# Patient Record
Sex: Male | Born: 1950 | Race: Black or African American | Hispanic: No | Marital: Married | Smoking: Current every day smoker
Health system: Southern US, Community
[De-identification: ages and names within clinical notes are randomized; demographics above are authoritative.]

## PROBLEM LIST (undated history)

## (undated) DIAGNOSIS — R079 Chest pain, unspecified: Secondary | ICD-10-CM

## (undated) DIAGNOSIS — Z87438 Personal history of other diseases of male genital organs: Secondary | ICD-10-CM

## (undated) DIAGNOSIS — E782 Mixed hyperlipidemia: Secondary | ICD-10-CM

## (undated) DIAGNOSIS — I10 Essential (primary) hypertension: Secondary | ICD-10-CM

## (undated) DIAGNOSIS — Z720 Tobacco use: Secondary | ICD-10-CM

## (undated) HISTORY — DX: Tobacco use: Z72.0

## (undated) HISTORY — DX: Essential (primary) hypertension: I10

## (undated) HISTORY — DX: Mixed hyperlipidemia: E78.2

## (undated) HISTORY — DX: Chest pain, unspecified: R07.9

## (undated) HISTORY — DX: Personal history of other diseases of male genital organs: Z87.438

---

## 2004-10-15 ENCOUNTER — Inpatient Hospital Stay (HOSPITAL_COMMUNITY): Admission: AD | Admit: 2004-10-15 | Discharge: 2004-10-18 | Payer: Self-pay | Admitting: Internal Medicine

## 2004-10-15 ENCOUNTER — Encounter: Payer: Self-pay | Admitting: Emergency Medicine

## 2004-10-18 ENCOUNTER — Ambulatory Visit: Payer: Self-pay | Admitting: Internal Medicine

## 2004-10-18 ENCOUNTER — Encounter: Payer: Self-pay | Admitting: Cardiology

## 2004-11-02 ENCOUNTER — Ambulatory Visit: Payer: Self-pay | Admitting: Internal Medicine

## 2008-05-14 ENCOUNTER — Ambulatory Visit: Payer: Self-pay | Admitting: Cardiovascular Disease

## 2008-05-14 ENCOUNTER — Inpatient Hospital Stay (HOSPITAL_COMMUNITY): Admission: EM | Admit: 2008-05-14 | Discharge: 2008-05-15 | Payer: Self-pay | Admitting: Unknown Physician Specialty

## 2008-05-14 ENCOUNTER — Encounter: Admission: RE | Admit: 2008-05-14 | Discharge: 2008-05-15 | Payer: Self-pay | Admitting: Internal Medicine

## 2008-05-14 HISTORY — PX: CARDIAC CATHETERIZATION: SHX172

## 2008-05-28 ENCOUNTER — Ambulatory Visit: Payer: Self-pay | Admitting: Cardiology

## 2008-05-28 ENCOUNTER — Encounter: Payer: Self-pay | Admitting: Physician Assistant

## 2008-05-28 ENCOUNTER — Encounter (INDEPENDENT_AMBULATORY_CARE_PROVIDER_SITE_OTHER): Payer: Self-pay | Admitting: *Deleted

## 2008-05-28 DIAGNOSIS — F17200 Nicotine dependence, unspecified, uncomplicated: Secondary | ICD-10-CM

## 2008-05-28 DIAGNOSIS — R079 Chest pain, unspecified: Secondary | ICD-10-CM | POA: Insufficient documentation

## 2008-05-28 DIAGNOSIS — E785 Hyperlipidemia, unspecified: Secondary | ICD-10-CM

## 2008-05-28 DIAGNOSIS — I10 Essential (primary) hypertension: Secondary | ICD-10-CM

## 2008-05-28 DIAGNOSIS — Z87898 Personal history of other specified conditions: Secondary | ICD-10-CM

## 2008-05-28 DIAGNOSIS — R42 Dizziness and giddiness: Secondary | ICD-10-CM | POA: Insufficient documentation

## 2008-07-30 ENCOUNTER — Ambulatory Visit: Payer: Self-pay | Admitting: Cardiology

## 2008-07-30 DIAGNOSIS — I2510 Atherosclerotic heart disease of native coronary artery without angina pectoris: Secondary | ICD-10-CM | POA: Insufficient documentation

## 2008-09-11 ENCOUNTER — Ambulatory Visit: Payer: Self-pay | Admitting: Cardiology

## 2008-10-01 ENCOUNTER — Encounter: Payer: Self-pay | Admitting: Cardiology

## 2008-10-17 ENCOUNTER — Ambulatory Visit: Payer: Self-pay | Admitting: Cardiology

## 2008-10-17 LAB — CONVERTED CEMR LAB: Total CK: 177 units/L (ref 7–232)

## 2008-11-26 ENCOUNTER — Encounter (INDEPENDENT_AMBULATORY_CARE_PROVIDER_SITE_OTHER): Payer: Self-pay | Admitting: *Deleted

## 2009-01-01 ENCOUNTER — Encounter: Payer: Self-pay | Admitting: Cardiology

## 2009-01-01 ENCOUNTER — Telehealth (INDEPENDENT_AMBULATORY_CARE_PROVIDER_SITE_OTHER): Payer: Self-pay | Admitting: *Deleted

## 2009-01-01 ENCOUNTER — Ambulatory Visit: Payer: Self-pay | Admitting: Cardiology

## 2009-01-06 ENCOUNTER — Ambulatory Visit: Payer: Self-pay | Admitting: Cardiology

## 2009-01-09 ENCOUNTER — Ambulatory Visit: Payer: Self-pay | Admitting: Internal Medicine

## 2009-01-20 LAB — CONVERTED CEMR LAB
Alkaline Phosphatase: 41 units/L (ref 39–117)
Bilirubin, Direct: 0 mg/dL (ref 0.0–0.3)
HDL: 45.5 mg/dL (ref >39.00)
LDL Cholesterol: 108 mg/dL — ABNORMAL HIGH (ref 0–99)
Total Bilirubin: 1.1 mg/dL (ref 0.3–1.2)
Total CHOL/HDL Ratio: 4
Triglycerides: 123 mg/dL (ref 0.0–149.0)

## 2009-02-20 ENCOUNTER — Encounter (INDEPENDENT_AMBULATORY_CARE_PROVIDER_SITE_OTHER): Payer: Self-pay | Admitting: *Deleted

## 2009-04-23 ENCOUNTER — Ambulatory Visit: Payer: Self-pay | Admitting: Cardiology

## 2009-04-23 ENCOUNTER — Encounter: Payer: Self-pay | Admitting: Cardiology

## 2009-04-28 ENCOUNTER — Telehealth: Payer: Self-pay | Admitting: Cardiology

## 2009-04-28 ENCOUNTER — Ambulatory Visit: Payer: Self-pay | Admitting: Cardiology

## 2009-04-29 ENCOUNTER — Telehealth: Payer: Self-pay | Admitting: Cardiology

## 2009-04-29 LAB — CONVERTED CEMR LAB
Calcium: 9.5 mg/dL (ref 8.4–10.5)
Creatinine, Ser: 1 mg/dL (ref 0.4–1.5)
GFR calc non Af Amer: 98.39 mL/min (ref >60)
Sodium: 142 meq/L (ref 135–145)

## 2009-05-06 ENCOUNTER — Ambulatory Visit: Payer: Self-pay | Admitting: Cardiology

## 2009-06-16 ENCOUNTER — Encounter (INDEPENDENT_AMBULATORY_CARE_PROVIDER_SITE_OTHER): Payer: Self-pay | Admitting: *Deleted

## 2009-11-04 ENCOUNTER — Ambulatory Visit: Payer: Self-pay | Admitting: Cardiology

## 2009-11-05 ENCOUNTER — Telehealth (INDEPENDENT_AMBULATORY_CARE_PROVIDER_SITE_OTHER): Payer: Self-pay | Admitting: *Deleted

## 2010-04-22 NOTE — Assessment & Plan Note (Signed)
Summary: [facility]   Visit Type:  Follow-up Primary Provider:  [facility]  CC:  HTN.  History of Present Illness: The patient presents for followup of difficult to control hypertension. He had his meds adjusted recently in the [place] office when he presented for followup with one of the clinical trials. He was quite hypertensive. He says his blood pressure still runs in the 160/100 range. However, he is not taking his amlodipine and has been out of this for about 3 months. He has been fatigued. He says he's working too much in the hot sun and doesn't get very many days off. He is not having any chest pressure, neck or arm discomfort. He is having no palpitations, presyncope or syncope. He is not having PND or orthopnea.  Current Medications (verified): 1)  Fish Oil   Oil (Fish Oil) .Marland Kitchen.. 1000 Mg Daily 2)  Aspirin 81 Mg  Tabs (Aspirin) .... One Daily 3)  Benazepril Hcl 40 Mg Tabs (Benazepril Hcl) .... One Tab By Mouth Two Times A Day 4)  Carvedilol 6.25 Mg Tabs (Carvedilol) .... One By Mouth Two Times A Day 5)  Crestor 40 Mg Tabs (Rosuvastatin Calcium) .... One By Mouth Daily 6)  Amlodipine Besylate 10 Mg Tabs (Amlodipine Besylate) .... Daily  Allergies (verified): No Known Drug Allergies  Past History:  Past Medical History: TOBACCO ABUSE (ICD-305.1) BENIGN PROSTATIC HYPERTROPHY, HX OF (ICD-V13.8) HYPERLIPIDEMIA-MIXED (ICD-272.4) HYPERTENSION, BENIGN (ICD-401.1) CHEST PAIN-UNSPECIFIED (ICD-786.50)  Non-obstructive CAD (cath '06 & 05/14/08, normal LV fxn.  50% proximal LAD, luminal irregularities with 40% proximal circumflex, 25% proximal RCA, 40%, ramus.) PMH-FH-SH reviewed-no changes except otherwise noted  Review of Systems       Insomnia, fatigue. Ottherwise as stated in the HPI and negative for all other systems.  Vital Signs:  Patient profile:   60 year old male Height:      68 inches Weight:      161 pounds BMI:     24.57 Pulse rate:   60 / minute Resp:     18  per minute BP sitting:   166 / 102  (right arm)  Vitals Entered By: Marrion Coy, CNA (November 04, 2009 9:35 AM)  Physical Exam  General:  Well developed, well nourished, in no acute distress. Head:  normocephalic and atraumatic Eyes:  PERRLA/EOM intact; conjunctiva and lids normal. Mouth:  Teeth, gums and palate normal. Oral mucosa normal. Neck:  Neck supple, no JVD. No masses, thyromegaly or abnormal cervical nodes. Chest Wall:  no deformities or breast masses noted Lungs:  Clear bilaterally to auscultation and percussion. Heart:  Non-displaced PMI, chest non-tender; regular rate and rhythm, S1, S2 without murmurs, rubs or gallops. Carotid upstroke normal, no bruit. Normal abdominal aortic size, no bruits. Femorals normal pulses, no bruits. Pedals normal pulses. No edema, no varicosities. Abdomen:  Bowel sounds positive; abdomen soft and non-tender without masses, organomegaly, or hernias noted. No hepatosplenomegaly. Msk:  Back normal, normal gait. Muscle strength and tone normal. Extremities:  No clubbing or cyanosis. Neurologic:  Alert and oriented x 3. Skin:  Intact without lesions or rashes. Cervical Nodes:  no significant adenopathy Psych:  Normal affect.   Impression & Recommendations:  Problem # 1:  HYPERTENSION, BENIGN (ICD-401.1) He has been out of his amlodipine. I suspect his blood pressure will be controlled when he starts to this. He can have it checked at work and will present a blood pressure diary to our office. No other testing is indicated.  Problem # 2:  CAD (ICD-414.00) He has nonobstructive coronary disease. No further testing is indicated.  Other Orders: EKG w/ Interpretation (93000)  Patient Instructions: 1)  Your physician recommends that you schedule a follow-up appointment in: 6 months 2)  Your physician recommends that you continue on your current medications as directed. Please refer to the Current Medication list given to you  today. Prescriptions: AMLODIPINE BESYLATE 10 MG TABS (AMLODIPINE BESYLATE) daily  #30 x 11   Entered by:   Charolotte Capuchin, RN   Authorized by:   Rollene Rotunda, MD, [facility]   Signed by:   Charolotte Capuchin, RN on 11/04/2009   Method used:   Electronically to        [facility]  [address] (retail)       [address]       [place], [place]  [address]       Ph: [phone]       Fax: [phone]   RxID:   [id number]

## 2010-04-22 NOTE — Progress Notes (Signed)
  Recieved Medical Certification paper back from [facility] today,copied and Mailed back out to Pt. Kevin Burnett  November 05, 2009 11:17 AM

## 2010-04-22 NOTE — Miscellaneous (Signed)
  Clinical Lists Changes  Observations: Added new observation of RS STUDY: TRACER - study completion 04/23/09 (06/16/2009 12:11)      Research Study Name: TRACER - study completion 04/23/09

## 2010-04-22 NOTE — Assessment & Plan Note (Signed)
Summary: rov   Primary Provider:  Lindaann Pascal PA  CC:  add on per Ward Givens and NP.  HTN//Pt states he feels fine.  History of Present Illness: Patient was here for a tracer followup visit today and was seen by Marvis Repress and found to have a markedly elevated blood pressure at 213/111. He was sent over here for treatment. He has had no symptoms.  Current Medications (verified): 1)  Fish Oil   Oil (Fish Oil) .Marland Kitchen.. 1000 Mg Daily 2)  Aspirin 81 Mg  Tabs (Aspirin) .... One Daily 3)  Benazepril Hcl 20 Mg Tabs (Benazepril Hcl) .... One Tablet By Mouth Once Daily 4)  Carvedilol 3.125 Mg Tabs (Carvedilol) .... One Two Times A Day 5)  Crestor 40 Mg Tabs (Rosuvastatin Calcium) .... One By Mouth Daily 6)  Amlodipine Besylate 10 Mg Tabs (Amlodipine Besylate) .... Take One Tablet By Mouth Daily  Allergies (verified): No Known Drug Allergies  Past History:  Family History: Last updated: 05/28/2008 Negative FH of Diabetes, Hypertension, or Coronary Artery Disease  Social History: Last updated: 05/28/2008 Full Time Married  Alcohol Use - no Regular Exercise - yes Smokes 1 cigar/day for past 4 yrs Smoked heavy marijuana > 30 yrs  Past Medical History: Reviewed history from 01/06/2009 and no changes required. TOBACCO ABUSE (ICD-305.1) BENIGN PROSTATIC HYPERTROPHY, HX OF (ICD-V13.8) HYPERLIPIDEMIA-MIXED (ICD-272.4) HYPERTENSION, BENIGN (ICD-401.1) CHEST PAIN-UNSPECIFIED (ICD-786.50)  Non-obstructive CAD      cath '06 & 05/14/08, normal LV fxn.  50% proximal LAD, luminal irregularities with 40% proximal circumflex, 25% proximal RCA, 40%, ramus.  Review of Systems       ROS is negative except as outlined in HPI.   Vital Signs:  Patient profile:   60 year old male Height:      68 inches Weight:      163 pounds BMI:     24.87 Pulse rate:   55 / minute Pulse rhythm:   regular BP sitting:   186 / 110  (left arm) Cuff size:   large  Vitals Entered By: Burnett Kanaris, CNA (April 23, 2009 2:30 PM)  Physical Exam  Additional Exam:  Gen. Well-nourished, in no distress   Neck: No JVD, thyroid not enlarged, no carotid bruits Lungs: No tachypnea, clear without rales, rhonchi or wheezes Cardiovascular: Rhythm regular, PMI not displaced,  heart sounds  normal, no murmurs or gallops, no peripheral edema, pulses normal in all 4 extremities. Abdomen: BS normal, abdomen soft and non-tender without masses or organomegaly, no hepatosplenomegaly. MS: No deformities, no cyanosis or clubbing   Neuro:  No focal sns   Skin:  no lesions    Impression & Recommendations:  Problem # 1:  HYPERTENSION, BENIGN (ICD-401.1) His blood pressure was quite elevated but is alone but better now. We will give him acutely and Atacand 8 mg today. We will increase his benazepril Lotensin from 20 b.i.d. to 40 b.i.d. and will increase his carvedilol from 3.125 b.i.d. to 3.125 t.i.d. He is to come in next Tuesday for a nurse visit and blood pressure check and a BMP. His updated medication list for this problem includes:    Aspirin 81 Mg Tabs (Aspirin) ..... One daily    Benazepril Hcl 40 Mg Tabs (Benazepril hcl) ..... One tab by mouth two times a day    Carvedilol 3.125 Mg Tabs (Carvedilol) ..... One tab by mouth three times a day    Amlodipine Besylate 10 Mg Tabs (Amlodipine besylate) .Marland Kitchen... Take one tablet by  mouth daily  Other Orders: EKG w/ Interpretation (93[address])  Patient Instructions: 1)  Your physician recommends that you return for lab work on tuesday: bmet (401.1) 2)  Your physician has recommended you make the following change in your medication: 1) increase benazepril (lotensin) to 40mg  two times a day , 2) increase carvediolol (coreg) to 3.125mg  three times a day  Prescriptions: CARVEDILOL 3.125 MG TABS (CARVEDILOL) one tab by mouth three times a day  #90 x 6   Entered by:   Sherri Rad, RN, BSN   Authorized by:   Lenoria Farrier, MD, [facility]   Signed by:   Sherri Rad, RN, BSN on  04/23/2009   Method used:   Electronically to        [facility]  [address] (retail)       [address]       [place], [place]  [address]       Ph: [phone]       Fax: [phone]   RxID:   [id number] BENAZEPRIL HCL 40 MG TABS (BENAZEPRIL HCL) one tab by mouth two times a day  #60 x 6   Entered by:   Sherri Rad, RN, BSN   Authorized by:   Lenoria Farrier, MD, [facility]   Signed by:   Sherri Rad, RN, BSN on 04/23/2009   Method used:   Electronically to        [facility]  [address] (retail)       [address]       [place], [place]  [address]       Ph: [phone]       Fax: [phone]   RxID:   [id number]

## 2010-04-22 NOTE — Progress Notes (Signed)
Summary: bp check   Phone Note Outgoing Call   Call placed by: Sherri Rad, RN, BSN,  April 28, 2009 10:11 AM Call placed to: Patient Summary of Call: The pt came today for a nurse visit for labs and a bp check. The pt was taken to the lab first. I went to the lab to get him for a bp check and he had already left. I called the pt and left a message at his home # that we need to f/u on his bp. He needs to come back or call me with a f/u reading. Sherri Rad, RN, BSN  April 28, 2009 10:13 AM   Follow-up for Phone Call        returning call, Migdalia Dk  April 28, 2009 1:16 PM  I called to speak with the pt. Per his wife, he was coming back for his bp check. Sherri Rad, RN, BSN  April 28, 2009 2:41 PM

## 2010-04-22 NOTE — Assessment & Plan Note (Signed)
Summary: bp check  Nurse Visit   Vital Signs:  Patient profile:   60 year old male Pulse rate:   78 / minute Pulse rhythm:   regular Resp:     18 per minute BP sitting:   158 / 90  (right arm) Cuff size:   regular  Vitals Entered By: Sherri Rad, RN, BSN (April 28, 2009 2:45 PM)  Visit Type:  Nurse visit  CC:  The pt returns today for a bp check. On 04/23/09 we increased the pt's benazepril to 40mg  qd and increased coreg to 3.125mg  tid. The pt has taken his meds today. His only complaint is that he is unable to sleep. He has tried Tylenol pm. I made the pt aware that he would most likely need to get this from his PCP but I would address this with Dr. Juanda Chance to see if he will give him a one time dose on a sleep agent. .   Allergies: No Known Drug Allergies

## 2010-04-22 NOTE — Letter (Signed)
Summary: FMLA/Medical Certification  FMLA/Medical Certification   Imported By: Erle Crocker 11/06/2009 11:44:27  _____________________________________________________________________  External Attachment:    Type:   Image     Comment:   External Document

## 2010-04-22 NOTE — Assessment & Plan Note (Signed)
Summary: [facility]   Visit Type:  Follow-up Primary Provider:  [facility]  CC:  HTN.  History of Present Illness: The patient presents for followup of his difficult to control hypertension. He was recently seen for a research protocol followup and was noted to be very hypertensive. He was seen as an add-on in our clinic by Dr. Juanda Chance.  He had his meds adjusted to include increased carvedilol to t.i.d. and doubling his ACE inhibitor. He does not record his blood pressure at home but notes that it has been better when he has had it measured on a couple of occasions since that visit. He denies any ongoing symptoms such as chest discomfort, neck or arm discomfort. He has no shortness of breath. He is not having any palpitations, presyncope or syncope. He has had no weight gain or swelling. He has tolerated the medication adjustments.  Current Medications (verified): 1)  Fish Oil   Oil (Fish Oil) .Marland Kitchen.. 1000 Mg Daily 2)  Aspirin 81 Mg  Tabs (Aspirin) .... One Daily 3)  Benazepril Hcl 40 Mg Tabs (Benazepril Hcl) .... One Tab By Mouth Two Times A Day 4)  Carvedilol 3.125 Mg Tabs (Carvedilol) .... One Tab By Mouth Three Times A Day 5)  Crestor 40 Mg Tabs (Rosuvastatin Calcium) .... One By Mouth Daily 6)  Amlodipine Besylate 10 Mg Tabs (Amlodipine Besylate) .... Take One Tablet By Mouth Daily 7)  Ambien 5 Mg Tabs (Zolpidem Tartrate) .... Not Started  Allergies (verified): No Known Drug Allergies  Past History:  Past Medical History: Reviewed history from 01/06/2009 and no changes required. TOBACCO ABUSE (ICD-305.1) BENIGN PROSTATIC HYPERTROPHY, HX OF (ICD-V13.8) HYPERLIPIDEMIA-MIXED (ICD-272.4) HYPERTENSION, BENIGN (ICD-401.1) CHEST PAIN-UNSPECIFIED (ICD-786.50)  Non-obstructive CAD      cath '06 & 05/14/08, normal LV fxn.  50% proximal LAD, luminal irregularities with 40% proximal circumflex, 25% proximal RCA, 40%, ramus.  Review of Systems       As stated in the HPI and negative for all  other systems.   Vital Signs:  Patient profile:   60 year old male Height:      68 inches Weight:      163 pounds BMI:     24.87 Pulse rate:   67 / minute Resp:     16 per minute BP sitting:   138 / 78  (right arm)  Vitals Entered By: Marrion Coy, CNA (May 06, 2009 4:01 PM)  Physical Exam  General:  Well developed, well nourished, in no acute distress. Head:  normocephalic and atraumatic Eyes:  PERRLA/EOM intact; conjunctiva and lids normal. Mouth:  Teeth, gums and palate normal. Oral mucosa normal. Neck:  Neck supple, no JVD. No masses, thyromegaly or abnormal cervical nodes. Chest Wall:  no deformities or breast masses noted Lungs:  Clear bilaterally to auscultation and percussion. Abdomen:  Bowel sounds positive; abdomen soft and non-tender without masses, organomegaly, or hernias noted. No hepatosplenomegaly. Msk:  Back normal, normal gait. Muscle strength and tone normal. Extremities:  No clubbing or cyanosis. Neurologic:  Alert and oriented x 3. Skin:  Intact without lesions or rashes. Cervical Nodes:  no significant adenopathy Axillary Nodes:  no significant adenopathy Inguinal Nodes:  no significant adenopathy Psych:  Normal affect.   Detailed Cardiovascular Exam  Neck    Carotids: Carotids full and equal bilaterally without bruits.      Neck Veins: Normal, no JVD.    Heart    Inspection: no deformities or lifts noted.      Palpation: normal  PMI with no thrills palpable.      Auscultation: regular rate and rhythm, S1, S2 without murmurs, rubs, gallops, or clicks.    Vascular    Abdominal Aorta: no palpable masses, pulsations, or audible bruits.      Femoral Pulses: normal femoral pulses bilaterally.      Pedal Pulses: normal pedal pulses bilaterally.      Radial Pulses: normal radial pulses bilaterally.      Peripheral Circulation: no clubbing, cyanosis, or edema noted with normal capillary refill.     Impression & Recommendations:  Problem # 1:   HYPERTENSION, BENIGN (ICD-401.1) His blood pressure is much improved. However, we don't know what the readings are at home. He will buy a blood pressure cuff. Today I will titrate his carvedilol to 6.25 mg b.i.d. and continue the other medications. Further med changes will be based on future blood pressure readings.  Problem # 2:  TOBACCO ABUSE (ICD-305.1) He and I have discussed the need to stop smoking.  Problem # 3:  BENIGN PROSTATIC HYPERTROPHY, HX OF (ICD-V13.8) The patient describes frequent urination at night. I will give him the name of the urologist as this is significantly hindering his ability to get a good nights sleep.  Patient Instructions: 1)  Your physician recommends that you schedule a follow-up appointment in: 6 weeks 2)  Your physician has recommended you make the following change in your medication: Increase Carvedilol to 6.25 mf twice a day Prescriptions: CARVEDILOL 6.25 MG TABS (CARVEDILOL) one by mouth two times a day  #60 x 11   Entered by:   Charolotte Capuchin, RN   Authorized by:   Rollene Rotunda, MD, [facility]   Signed by:   Charolotte Capuchin, RN on 05/06/2009   Method used:   Electronically to        [facility]  [address] (retail)       [address]       [place], [place]  [address]       Ph: [phone]       Fax: [phone]   RxID:   [id number]

## 2010-04-22 NOTE — Progress Notes (Signed)
Summary: BP f/u  Phone Note Outgoing Call   Call placed by: Sherri Rad, RN, BSN,  April 29, 2009 5:16 PM Call placed to: Patient Summary of Call: I called the pt and left a message on his home # that Dr. Juanda Chance felt his labs were fine, and that no further medication changes were needed at the present time. The pt does need to f/u with Dr. Antoine Poche in the [place] office in 2 weeks per Dr. Juanda Chance. An appt has been scheduled for 05/06/09 @ 3:45pm. Per Dr. Juanda Chance, he is willing to prescribe ambien 5mg  one tab by mouth as needed sleep # 30 with no refills. I have left all this information on the pt's identified answering machine per his request. I stated that any additional refills would need to come from his PCP on ambien.  Initial call taken by: Sherri Rad, RN, BSN,  April 29, 2009 5:21 PM    New/Updated Medications: AMBIEN 5 MG TABS (ZOLPIDEM TARTRATE) one tab by mouth at bedtime as needed for sleep. Prescriptions: AMBIEN 5 MG TABS (ZOLPIDEM TARTRATE) one tab by mouth at bedtime as needed for sleep.  #30 x 0   Entered by:   Sherri Rad, RN, BSN   Authorized by:   Lenoria Farrier, MD, [facility]   Signed by:   Sherri Rad, RN, BSN on 04/29/2009   Method used:   Telephoned to ...       [facility]  [address] (retail)       [address]       [place], [place]  [address]       Ph: [phone]       Fax: [phone]   RxID:   [id number]

## 2010-07-06 LAB — BASIC METABOLIC PANEL
BUN: 16 mg/dL (ref 6–23)
CO2: 24 mEq/L (ref 19–32)
Calcium: 9.1 mg/dL (ref 8.4–10.5)
Chloride: 105 mEq/L (ref 96–112)
Creatinine, Ser: 0.99 mg/dL (ref 0.4–1.5)
GFR calc Af Amer: >60 mL/min (ref >60)
Glucose, Bld: 98 mg/dL (ref 70–99)

## 2010-07-06 LAB — PROTIME-INR
INR: 1 (ref 0.00–1.49)
INR: 1 (ref 0.00–1.49)
Prothrombin Time: 13.6 s (ref 11.6–15.2)

## 2010-07-06 LAB — DIFFERENTIAL
Basophils Absolute: 0.1 10*3/uL (ref 0.0–0.1)
Basophils Relative: 0 % (ref 0–1)
Basophils Relative: 1 % (ref 0–1)
Eosinophils Absolute: 0.1 10*3/uL (ref 0.0–0.7)
Eosinophils Absolute: 0.1 10*3/uL (ref 0.0–0.7)
Eosinophils Relative: 1 % (ref 0–5)
Eosinophils Relative: 1 % (ref 0–5)
Lymphocytes Relative: 25 % (ref 12–46)
Lymphs Abs: 1.7 K/uL (ref 0.7–4.0)
Monocytes Absolute: 0.4 10*3/uL (ref 0.1–1.0)
Monocytes Absolute: 0.5 K/uL (ref 0.1–1.0)
Monocytes Relative: 5 % (ref 3–12)
Monocytes Relative: 7 % (ref 3–12)
Neutro Abs: 4.6 10*3/uL (ref 1.7–7.7)
Neutrophils Relative %: 66 % (ref 43–77)

## 2010-07-06 LAB — CBC
HCT: 43.2 % (ref 39.0–52.0)
HCT: 44.2 % (ref 39.0–52.0)
Hemoglobin: 14.6 g/dL (ref 13.0–17.0)
Hemoglobin: 14.8 g/dL (ref 13.0–17.0)
MCHC: 33.4 g/dL (ref 30.0–36.0)
MCHC: 33.9 g/dL (ref 30.0–36.0)
MCV: 83.5 fL (ref 78.0–100.0)
MCV: 85 fL (ref 78.0–100.0)
Platelets: 301 10*3/uL (ref 150–400)
RBC: 5.18 MIL/uL (ref 4.22–5.81)
RBC: 5.2 MIL/uL (ref 4.22–5.81)
RDW: 13.3 % (ref 11.5–15.5)
WBC: 6.9 K/uL (ref 4.0–10.5)

## 2010-07-06 LAB — POCT CARDIAC MARKERS
CKMB, poc: 5.1 ng/mL (ref 1.0–8.0)
Myoglobin, poc: 29.7 ng/mL (ref 12–200)

## 2010-07-06 LAB — HEMOGLOBIN A1C
Hgb A1c MFr Bld: 6.1 % (ref 4.6–6.1)
Mean Plasma Glucose: 128 mg/dL

## 2010-07-06 LAB — BASIC METABOLIC PANEL WITH GFR
GFR calc non Af Amer: >60 mL/min (ref >60)
Potassium: 3.9 meq/L (ref 3.5–5.1)
Sodium: 135 meq/L (ref 135–145)

## 2010-07-06 LAB — COMPREHENSIVE METABOLIC PANEL
Albumin: 3.3 g/dL — ABNORMAL LOW (ref 3.5–5.2)
BUN: 12 mg/dL (ref 6–23)
Calcium: 8.6 mg/dL (ref 8.4–10.5)
Glucose, Bld: 92 mg/dL (ref 70–99)
Sodium: 134 mEq/L — ABNORMAL LOW (ref 135–145)
Total Protein: 6.6 g/dL (ref 6.0–8.3)

## 2010-07-06 LAB — LIPID PANEL
Cholesterol: 202 mg/dL — ABNORMAL HIGH (ref 0–200)
HDL: 39 mg/dL — ABNORMAL LOW (ref >39)
LDL Cholesterol: 144 mg/dL — ABNORMAL HIGH (ref 0–99)
Total CHOL/HDL Ratio: 5.2 RATIO
VLDL: 19 mg/dL (ref 0–40)

## 2010-07-06 LAB — APTT
aPTT: 31 seconds (ref 24–37)
aPTT: 33 seconds (ref 24–37)

## 2010-07-06 LAB — CARDIAC PANEL(CRET KIN+CKTOT+MB+TROPI): Troponin I: <0.01 ng/mL (ref 0.00–0.06)

## 2010-07-06 LAB — CK TOTAL AND CKMB (NOT AT ARMC): Relative Index: 0.9 (ref 0.0–2.5)

## 2010-07-26 ENCOUNTER — Other Ambulatory Visit: Payer: Self-pay | Admitting: Cardiology

## 2010-08-03 NOTE — Discharge Summary (Signed)
Kevin Burnett, COSENS NO.:  [id number]   MEDICAL RECORD NO.:  [id number]          PATIENT TYPE:  INP   LOCATION:  2507                         FACILITY:  [facility]   PHYSICIAN:  Veverly Fells. Excell Seltzer, MD  DATE OF BIRTH:  07-22-1950   DATE OF ADMISSION:  05/14/2008  DATE OF DISCHARGE:                               DISCHARGE SUMMARY   PRIMARY CARDIOLOGIST:  Rollene Rotunda, MD, [facility]   PRIMARY CARE Gjon Letarte:  Lindaann Pascal, PA, [facility].   DISCHARGE DIAGNOSIS:  Chest pain.   SECONDARY DIAGNOSES:  1. Nonobstructive coronary artery disease by cardiac catheterization      this admission.  2. Hypertension diagnosed at age 25.  3. Hyperlipidemia.  4. Ongoing tobacco/cigar abuse.  5. Benign prostatic hypertrophy.   ALLERGIES:  LIPITOR causes nausea.   PROCEDURES:  Left heart cardiac catheterization revealing nonobstructive  coronary artery disease and normal LV function, and an EF of 60-65%.   HISTORY OF PRESENT ILLNESS:  A 60 year old [place] American male with a  prior history of nonobstructive CAD by cardiac catheterization in July  2006.  He was in his usual state of health until approximately 1 week  prior to admission when he began to experience intermittent exertional  substernal chest discomfort radiating to the neck, associated with  dyspnea less than 20 minutes and resolving with rest.  He had recurrent  symptoms in the morning of May 13, 2008, which persisted throughout  the day and evening.  He had a very restless night into the May 14, 2008, and presented to his primary care Haylee Mcanany after his discomfort  became associated with diaphoresis while at work.  He has a history of  nitroglycerin and morphine.  The EMS was activated.  His ECG showed new  lateral T-wave inversion and flattening, as well as 1-2 mm antegrade J-  point elevation, which was not new.  He was taken to the [facility] ED  and given additional nitroglycerin  and morphine with complete relief of  discomfort.  His initial troponin in the emergency room was 0.14 with  normal CK's and MB's.  The patient was admitted for further evaluation  and management of presumed non-ST elevation MI.   HOSPITAL COURSE:  The patient was taken urgently to the cath lab where a  left heart cardiac catheterization was performed revealing  nonobstructive coronary artery disease with an EF of 60-65%.  Subsequent  cardiac markers have returned normal.  He has had no recurrent chest  discomfort, and has been ambulating this morning without symptoms and  limitations.  Because he was hypertensive on admission, we have had the  beta-blocker therapy to his home regimen and have also switched him over  to generic medications to make them more affordable on his request.  He  is being discharged home today in good condition.   DISCHARGE LABORATORY DATA:  Hemoglobin 14.8, hematocrit 44.2, WBC 7.9,  and platelets 299.  INR 1.0, sodium 134, potassium 4.0, chloride 105,  CO2 22, BUN 12, creatinine 27, glucose 92, total bilirubin 1.1, alkaline  phosphatase 47, AST 19, ALT 11, total protein 6.6, albumin 3.3,  potassium 8.6, magnesium 2.4, CK 339, MB 2.3, and troponin I 0.02.  Total cholesterol 202, triglycerides 93, HDL 39, LDL 144.   DISPOSITION:  The patient will be discharged to home today in good  condition.   FOLLOWUP APPOINTMENTS:  We have recommended a followup with his primary  care Chamya Hunton as scheduled.  He is to follow up with Dr. Jenene Slicker  nurse practitioner in our [place] office on May 29, 2008 at 2:15  p.m.   DISCHARGE MEDICATIONS:  1. Tracer study drug daily.  2. Aspirin 81 mg daily.  3. Coreg 3.125 mg b.i.d.  4. Simvastatin 40 mg nightly.  5. Fish oil 1 g daily.  6. Amlodipine 10 mg daily.  7. Benazepril 20 mg daily.   OUTSTANDING LABORATORY STUDIES:  None.   DURATION OF DISCHARGE/ENCOUNTER:  40 minutes including physician time.       Nicolasa Ducking, ANP      Veverly Fells. Excell Seltzer, MD  Electronically Signed    CB/MEDQ  D:  05/15/2008  T:  05/15/2008  Job:  [id number]   cc:   Lindaann Pascal, PA

## 2010-08-03 NOTE — H&P (Signed)
NAMELYNN, SISSEL NO.:  [id number]   MEDICAL RECORD NO.:  [id number]          PATIENT TYPE:  EMS   LOCATION:  MAJO                         FACILITY:  [facility]   PHYSICIAN:  Duke Salvia, MD, FACCDATE OF BIRTH:  12-01-1950   DATE OF ADMISSION:  05/14/2008  DATE OF DISCHARGE:                              HISTORY & PHYSICAL   PRIMARY CARDIOLOGIST:  Bevelyn Buckles. Bensimhon, MD   PRIMARY CARE Makaylynn Bonillas:  Lindaann Pascal, PA at [facility].   PATIENT PROFILE:  A 60 year old African American male with prior history  of nonobstructive CAD who presents with a 1-week history of progressive  intermittent chest pain and he is ruling in for non-ST-elevation MI.   PROBLEMS:  1. Non-ST segment elevation MI.  2. History of nonobstructive CAD.      a.     October 18, 2004, cardiac catheterization, left main minor       irregularities.  LAD 40% proximal.  D-1 40-50% ostial.  Left       circumflex normal.  RCA minor irregularities.  EF 65% without       regional wall motion abnormalities.  3. Hypertension since age 60.  4. Hyperlipidemia.  5. Ongoing tobacco abuse, currently smoking one cigar a day for the      last 4 years.  6. Previous heavy marijuana usage for 30 years, quitting about 4 years      ago.  7. BPH.   HISTORY OF PRESENT ILLNESS:  A 60 year old African American male with  history of chest pain and nonobstructive CAD by cardiac catheterization  in July 2006.  He is very active at work and at home and had been doing  well, but over the past week, he has been experiencing daily to twice  daily exertional substernal chest pressure radiating to the left neck  associated with dyspnea lasting approximately 20 minutes and resolving  with rest.  Yesterday morning he awoke with 7/10 chest discomfort and  despite this went to work with progressive symptoms throughout the day.  Upon return home last night, he had difficulty sleeping, was very  restless.   This morning got up and went to work, but chest pain worsened  to 9/10 and he also became diaphoretic.  He went to [facility] and there an ECG was performed and he was treated with  nitroglycerin and morphine and EMS was activated.  On arrival to the ER,  he still had some discomfort and he was given additional nitroglycerin  and morphine and he is currently painfree.  ECG does show 1-2 mm  anterior J-point elevation, which is not new, although he does have new  lateral T-wave flattening and inversion.   ALLERGIES:  LIPITOR causes GI upset.   HOME MEDICATIONS:  1. Aspirin 81 mg daily.  2. Lotrel 10/20 mg daily.  3. Fish oil 1 g daily.   FAMILY HISTORY:  Mother died of pneumonia.  Father died of cancer.  There is no early CAD in his family.   SOCIAL HISTORY:  Lives in  [place] with his wife.  He works at a [employer] where he says he walks up to 40 miles a day while at  work.  He also works on cars.  He smokes one cigar a day for the past 4  years.  He denies any alcohol use.  He previously smoked marijuana  rather heavily between 1974 and 2006.  He does not exercise outside of  work but as stated he walks quite a bit while at work.   REVIEW OF SYSTEMS:  Positive for chest pain, dyspnea on exertion, and  diaphoresis.  He also reports polyuria.  He is a full code.  Otherwise,  all systems reviewed are negative.   PHYSICAL EXAMINATION:  VITAL SIGNS:  Temperature 98.0, heart rate 68,  respirations 13, blood pressure 150/94, pulse ox 99% on 2 liters.  GENERAL:  A pleasant African American male in no acute distress.  Awake,  alert, and oriented x3.  HEENT:  Normal.  PSYCHIATRIC:  Normal affect.  NEUROLOGIC:  Grossly intact and nonfocal.  SKIN:  Warm and dry without lesions or masses.  MUSCULOSKELETAL:  Grossly normal without deformity or effusion.  NECK:  No bruits or JVD.  LUNGS:  Respirations are unlabored.  Clear to auscultation.   CARDIAC:  Regular S1 and S2.  No S3-S4 murmurs.  ABDOMEN:  Round, soft, nontender, and nondistended.  Bowel sounds  present x4.  EXTREMITIES:  Warm, dry, and pink.  No clubbing, cyanosis, or edema.  Dorsalis pedis, posterior tibial pulses 2+ and equal bilaterally.  No  femoral bruits are noted.   ACCESSORY CLINICAL FINDINGS:  Chest x-ray shows no active  cardiopulmonary disease.  EKG shows sinus rhythm at the rate of 83 beats  per minute, left axis deviation, he has 1-2 mm anterior J-point  elevation which is old.  He also has lateral T-wave inversions and  flattening which is new.   LABORATORY DATA:  Hemoglobin 14.6, hematocrit 43.2, WBC 6.9, and  platelets 301.  Sodium 135, potassium 3.9, chloride 105, CO2 of 24, BUN  16, creatinine 0.99, glucose 98.  CK-MB 5.1 and troponin I 0.14.  INR  1.0.   ASSESSMENT AND PLAN:  1. Non-ST-elevation myocardial infarction.  The patient has had      progressive symptoms for the past week with constant symptoms over      the past 24 hours.  Now painfree after nitroglycerin and morphine      here in the ED.  A CK-MB is 5.1, troponin-I 0.14.  Plan to admit      and cycle cardiac markers.  Add heparin, load Plavix, add beta-      blocker as well statin.  He previously had a gastrointestinal upset      with Lipitor and we will use Crestor.  Plan catheterization this      afternoon.  2. Hypertension, we will add Coreg.  Continue ACE and calcium channel      blocker.  3. Hyperlipidemia.  Check lipids and LFTs.  Add Crestor.  4. Tobacco abuse.  Smoking cessation strongly advised.  He will obtain      smoking cessation consult.      Nicolasa Ducking, ANP      Duke Salvia, MD, [facility]  Electronically Signed    CB/MEDQ  D:  05/14/2008  T:  05/15/2008  Job:  [id number]

## 2010-08-06 NOTE — H&P (Signed)
NAME:  Kevin Burnett, Kevin Burnett NO.:  [id number]   MEDICAL RECORD NO.:  [id number]          PATIENT TYPE:  INP   LOCATION:  4705                         FACILITY:  [facility]   PHYSICIAN:  Arvilla Meres, M.D. [facility] OF BIRTH:  18-Jun-1950   DATE OF ADMISSION:  10/15/2004  DATE OF DISCHARGE:                                HISTORY & PHYSICAL   REASON FOR ADMISSION:  Kevin Burnett is a 60 year old male with no known cardiac  history, but with multiple cardiac risk factors, notable for hypertension  since age 68, hyperlipidemia, tobacco, and age, who now presents from [facility] ER for evaluation of syncope and abnormal cardiac markers.   The patient presents with an episode of syncope which occurred yesterday at  approximately 5 p.m. while awaiting for his meal in a local restaurant.  When his name was called, he stood up and subsequently started feeling weak  but with no associated prodromal chest pain, nausea, diaphoresis, or tachy  palpitations.  He subsequently passed out and was attended to by people in  the restaurant.  He feels that he was out for a few seconds but did not  subsequently seek medical attention.   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  He subsequently felt dizzy but did not have any recurrent syncope.  Moreover, he did not have any chest discomfort.  He presented to his primary  care physician and was directed to [facility] ER.  Admission  electrocardiogram revealed J point elevation with Q waves in leads V1-V3  suggestive of possible prior anterior septal MI.  Cardiac markers notable  for initial troponin of 0.88 with a followup level of 0.28.  The patient was  treated with heparin, intravenous nitroglycerin, and 5 mg of IV Lopressor.  A CT scan of the head was negative.   ALLERGIES:  No known drug allergies.   MEDICATIONS PRIOR TO  ADMISSION:  1.  Lotrel 10/20 daily.  2.  Aspirin 325 mg daily.  3.  Fish oil 1 g daily.   PAST MEDICAL HISTORY:  1.  Hypertension (since age 75).  2.  Hyperlipidemia.  3.  BPH.  4.  The patient had a reportedly negative routine treadmill test in April      2006; however, review of test results notable for development of 1.8 mm      ST depression in lead V6 which improved with exercise.  The patient      achieved 108% of PMHR with a maximum blood pressure of 220/90.   The patient reports no major surgery.  Of note, he also reports having had  an MI with associated syncope approximately 7 years ago.  He was treated  here at [facility].  He denies having had a cardiac catheterization  at that time and was told that his symptoms were secondary to dehydration.   SOCIAL HISTORY:  The patient lives in the [place] area with his wife.  He  has three children and four grandchildren.  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  He smokes two cigars a week but  denies any history of cigarette smoking.  He denies any illicit drug use.   FAMILY HISTORY:  Father deceased secondary to cancer.  Mother deceased  secondary to complications from pneumonia.  There is no known family history  of coronary artery disease.   REVIEW OF SYSTEMS:  The patient denies any prior history of chest pain or  any recent development of exertional dyspnea, orthopnea, paroxysmal  nocturnal dyspnea, or lower extremity edema.  Does have occasional  palpitations.  He also notes recent occasional dizziness.  The patient  denies any fevers, chills, sweats, or productive cough.  He reports history  of intermittent gross hematuria with recent hematuria which has been  improving in the last few days.  He denies any other evidence of overt  bleeding.  He also notes some discomfort in the left lower back region.  Remaining systems negative.   PHYSICAL EXAMINATION:  VITAL SIGNS:  Blood  pressure 124/77, pulse 58,  respirations 18, temperature 97.8, weight 175.  Orthostatic blood pressure  [facility]): 136/84, pulse 55 supine; 139/72, pulse 66 sitting;  126/78, pulse 69 standing.  GENERAL:  A 60 year old male in no apparent distress.  HEENT:  Normocephalic and atraumatic.  NECK:  Preserved carotid pulses without bruits.  No JVD.  LUNGS:  Clear to auscultation all fields.  HEART:  Regular rhythm with decreased rate; 2/6 holosystolic murmur.  ABDOMEN:  Soft, nontender.  Positive bowel sounds without bruits.  No  hepatosplenomegaly.  EXTREMITIES:  Preserved right femoral pulse without bruits; intact distal  pulses.  No pedal edema.  NEUROLOGIC:  Alert and oriented; no focal deficits.   Head CT [facility]):  No acute abnormalities.   Electrocardiogram:  Sinus bradycardia at 59 beats per minute with borderline  left axis deviation; elevated J point leads V2-V6 and question of old  anterior septal MI.   LABORATORY DATA:  Hemoglobin 12.8, hematocrit 38, WBC 10, platelets 365.  Sodium 134, potassium 3.3, BUN 18, creatinine 1.2, glucose 125.  D-dimer  0.2. __________ less than 30.  Urinalysis notable for microscopic hematuria,  otherwise negative.  Cardiac markers (covered as point-of-care): MB 12.2,  5.9; troponin I 0.88, followup 0.28.   Lipid profile from April 2006:  Total cholesterol 214, triglycerides 174,  HDL 46, and LDL 133.   IMPRESSION:  1.  Syncope.  2.  Exertional neck/shoulder discomfort.  3.  Abnormal troponin markers.  4.  Recent gross hematuria.  5.  Longstanding hypertension.  6.  Hyperlipidemia.  7.  Tobacco.  8.  Benign prostatic hypertrophy.  9.  Mild hypokalemia.   PLAN:  The patient will be admitted to telemetry for further evaluation and  management of syncope and abnormal troponin markers.  The patient's presentation with syncope may be due to a primary arhythmia versus ischemic  etiology.  He presents with no focal  neurologic deficit and a head CT scan  at [facility] was negative.   The patient will be observed on telemetry, and we will proceed with  extensive workup consisting of a 2-D echocardiogram, carotid Dopplers to  rule out significant ICA stenosis, followed by cardiac catheterization on  Monday.  Of note, carotid hypersensitivity was performed during evaluation,  and there were no associated pauses on telemetry.  In addition to serial  cardiac markers, we will  also repeat a metabolic profile in the morning with  magnesium level.  Hypokalemia will be repleted this evening.  We will  continue current home medications which include Lotrel, continue patient on  IV heparin.  Nitroglycerin will be discontinued.  We will also check a urine  drug screen and also schedule an abdominal CT scan (without contrast) to  rule out renal cell carcinoma given the reported history of recurrent gross  hematuria and the finding of microscopic hematuria during recent urinalysis.       GS/MEDQ  D:  10/15/2004  T:  10/15/2004  Job:  [id number]   cc:   Lindaann Pascal, P.A.C.  [facility]

## 2010-08-06 NOTE — Discharge Summary (Signed)
Kevin Burnett, Kevin Burnett NO.:  [id number]   MEDICAL RECORD NO.:  [id number]          PATIENT TYPE:  INP   LOCATION:  4705                         FACILITY:  [facility]   PHYSICIAN:  Arvilla Meres, M.D. LHCDATE OF BIRTH:  Sep 26, 1950   DATE OF ADMISSION:  10/15/2004  DATE OF DISCHARGE:  10/18/2004                                 DISCHARGE SUMMARY   PROCEDURES:  1.  Cardiac catheterization.  2.  Coronary arteriogram.  3.  Left ventriculogram.   DISCHARGE DIAGNOSES:  1.  Syncope, no significant arrhythmia noted and status post catheterization      with no ischemia.  2.  Hypertension.  3.  Hyperlipidemia.  4.  Ongoing tobacco use.  5.  History of benign prostatic hypertrophy.   HOSPITAL COURSE:  Mr. Sangiovanni is a 60 year old male with no known coronary  artery disease. He has a long history of hypertension and hyperlipidemia.  The hyperlipidemia has been untreated. He had a complete loss of  consciousness and he had no prodromal symptoms. He had no incontinence of  bladder or bowel. He did not seek medical attention. He had neck pain and  possibly some chest pain and dizziness on the day of admission. He was  admitted for further evaluation and treatment.   His troponin was mildly increased on initial point care markers at 0.28. His  further CK-MB and troponins were within normal limits. There was no  significant arrhythmia seen on the monitors. It was felt that he needed  cardiac catheterization to define his anatomy and he had carotid Doppler's  performed as well.   The cardiac catheterization showed between 20% and 50% stenosis in the LAD  and diagonal, but no other significant disease and his EF was 65% with no  wall motion abnormalities or MR. Dr. Gala Romney felt that there was no  obvious cause for syncope and felt that he might of had a spurious troponin  elevation. He felt that an event monitor would be appropriate to evaluate  the syncope.   As part of his  evaluation, he had a lipid profile performed which showed a  total cholesterol 227, triglycerides 138, HDL 39, LDL 160. He had Zocor 20  added to his medication regimen and was encouraged to stick to a low-fat  diet.   On his further evaluation, he had a TSH checked which was within normal  limits. Initially, his potassium was low at 3.2 and this was supplemented.  He also had carotid Doppler's performed which showed no ICA stenosis  bilaterally and antegrade vertebral artery flow. He had Zocor 20 milligrams  added to his medication regimen for lipid control and HCTZ 25 as well as  potassium 20 added for blood pressure and hypokalemia.   Postcatheterization, Mr. Eplin bed rest is pending completion. His groin is  stable with ambulation and he has no pain or further symptoms. He is  tentatively considered stable for discharge on October 18, 2004 with outpatient  follow-up arranged.   DISCHARGE INSTRUCTIONS:  He is to call our office for problems with the cath  site.  He is to stick to a low-fat diet. He is to do no driving for two days  and lifting for two weeks. He is to follow up with Dr. Gala Romney and get a  BMET in one week with lipid and liver profile in six weeks and an event  monitor which is to be for two weeks. The office has been contacted and will  call him with the follow-up appoint with Dr. Gala Romney and with the event  monitor.   DISCHARGE MEDICATIONS:  1.  Lotrel 10/20 mg q.d.  2.  Aspirin 325 milligrams q.d.  3.  Fish oil daily.  4.  Norvasc 10 milligrams q.d.  5.  HCTZ 25 milligrams q.d.  6.  K-Dur 20 mEq q.d.  7.  Zocor 20 milligrams q.d.       RB/MEDQ  D:  10/18/2004  T:  10/18/2004  Job:  [id number]   cc:   Lindaann Pascal, M.D.   Arvilla Meres, M.D. [facility]

## 2010-08-06 NOTE — Cardiovascular Report (Signed)
NAMEGRACIANO, Burnett NO.:  [id number]   MEDICAL RECORD NO.:  [id number]          PATIENT TYPE:  INP   LOCATION:  4705                         FACILITY:  [facility]   PHYSICIAN:  Arvilla Meres, M.D. LHCDATE OF BIRTH:  07-30-1950   DATE OF PROCEDURE:  10/18/2004  DATE OF DISCHARGE:                              CARDIAC CATHETERIZATION   The second and then some dictating cardiac catheterization report on Mr. a  bimalleolar medical record number [id number].   PRIMARY CARE PHYSICIAN:  Scott A. Gerda Diss, M.D.   CARDIOLOGIST:  Dr. Gala Romney.   PATIENT IDENTIFICATION:  Kevin Burnett is a 60 year old male with a history of  hypertension and hypercholesterolemia who was admitted with abrupt syncopal  episode and his first point of care troponin was mildly elevated at 0.88.  However, subsequent MBs and troponins were negative. He reports a history of  syncope in the remote past as well as previous MI and had cardiac  catheterization at that time which has been unrevealing. 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 This past Thursday, he had an abrupt syncopal episode on  walking into a restaurant. There was no associated chest pain or shortness  of breath. He denies any significant anginal symptoms at baseline.   PROCEDURES PERFORMED:  1.  Selective coronary angiography.  2.  Left heart catheterization.  3.  Left ventriculogram.  4.  StarClose femoral artery closure device.   DESCRIPTION OF PROCEDURE:  The risks and benefits of catheterization were  explained to Kevin Burnett; consent was signed and placed on the chart. A 6-  [place] arterial sheath was placed in the right femoral artery using a  modified Seldinger technique. Standard catheters including JL-4, JR-4 and  angled pigtail were used for the procedure. All catheter exchanges were made  over wire. There were no apparent complications. At the end of the  procedure, the patient had his right femoral artery sealed with a StarClose  device.   A central aortic pressure was 145/79 with a mean of 104. LV pressure is  142/4/12. There is no gradient on aortic valve pullback.   Left main was a long vessel with just minimal luminal irregularity.   LAD was a long vessel that wrapped the apex. It gave off a moderate-sized  first diagonal and small distal second diagonal. There was 40% narrowing in  the proximal portion just near the takeoff of the first diagonal. In the  ostium of the first diagonal, there appeared to be 40-50% ostial narrowing.  There was some luminal irregularities in the mid LAD but no flow-limiting  stenosis.   Left circumflex was a moderate size vessel. It gave off a large ramus, large  OM1 and small OM2. The AV groove circumflex was tiny. There was no  angiographic CAD.   Right coronary artery was a large tortuous vessel. There was minor  irregularity in mid section. The vessel essentially ended a long PDA. There  is no significant CAD.   Left ventriculogram shot in the RAO approach  showed an EF of approximately  65% with no wall motion abnormalities or mitral regurgitation.   ASSESSMENT:  1.  Mild nonobstructive coronary disease and the left anterior descending      artery.  2.  Normal left ventricular function.  3.  Poorly controlled hypertension.   DISCUSSION/PLAN:  At this point, there is no obvious cause of his syncope. I  suspect his troponin elevation may be spurious. He will need aggressive risk  factor modification, especially with attention to his hypertension but as  well as his cholesterol. I will be able to discharge him home today but  would recommend a two week event monitor just to follow up. He has had  echocardiogram here which showed no significant abnormalities.       DB/MEDQ  D:  10/18/2004  T:  10/18/2004  Job:  [id number]   cc:   Lorin Picket A. Gerda Diss, MD  [address], [address]  [place]  [place]  [address]  Fax: [phone]

## 2010-08-19 ENCOUNTER — Encounter: Payer: Self-pay | Admitting: Cardiology

## 2010-09-01 ENCOUNTER — Ambulatory Visit: Payer: PRIVATE HEALTH INSURANCE | Admitting: Cardiology

## 2010-09-29 ENCOUNTER — Ambulatory Visit: Payer: PRIVATE HEALTH INSURANCE | Admitting: Cardiology

## 2010-11-16 ENCOUNTER — Encounter: Payer: Self-pay | Admitting: Cardiology

## 2010-11-17 ENCOUNTER — Encounter: Payer: Self-pay | Admitting: Cardiology

## 2010-11-17 ENCOUNTER — Ambulatory Visit (INDEPENDENT_AMBULATORY_CARE_PROVIDER_SITE_OTHER): Payer: PRIVATE HEALTH INSURANCE | Admitting: Cardiology

## 2010-11-17 DIAGNOSIS — I10 Essential (primary) hypertension: Secondary | ICD-10-CM

## 2010-11-17 DIAGNOSIS — E7800 Pure hypercholesterolemia, unspecified: Secondary | ICD-10-CM

## 2010-11-17 DIAGNOSIS — E785 Hyperlipidemia, unspecified: Secondary | ICD-10-CM

## 2010-11-17 DIAGNOSIS — F17200 Nicotine dependence, unspecified, uncomplicated: Secondary | ICD-10-CM

## 2010-11-17 DIAGNOSIS — I2510 Atherosclerotic heart disease of native coronary artery without angina pectoris: Secondary | ICD-10-CM

## 2010-11-17 DIAGNOSIS — R9431 Abnormal electrocardiogram [ECG] [EKG]: Secondary | ICD-10-CM

## 2010-11-17 NOTE — Assessment & Plan Note (Signed)
We discussed the need to avoid all tobacco.

## 2010-11-17 NOTE — Assessment & Plan Note (Signed)
The blood pressure is at target. No change in medications is indicated. We will continue with therapeutic lifestyle changes (TLC).  

## 2010-11-17 NOTE — Assessment & Plan Note (Signed)
At this point given his previous coronary disease stress testing is indicated. However, his baseline EKG makes treadmill testing in aggregate. He will need stress perfusion imaging.

## 2010-11-17 NOTE — Progress Notes (Signed)
HPI The patient presents for follow up of his known CAD.  Since I last saw him he has no new cardiovascular complaints. He does exerting work. With this he denies any chest pressure, neck or arm discomfort. He is having palpitations, presyncope or syncope. Denies smoking cigarettes but he does smoke cigars. He is not exercising routinely. He has no new shortness of breath.  No Known Allergies  Current Outpatient Prescriptions  Medication Sig Dispense Refill  . amLODipine (NORVASC) 10 MG tablet Take 10 mg by mouth daily.        Marland Kitchen aspirin 81 MG tablet Take 81 mg by mouth daily.        . carvedilol (COREG) 6.25 MG tablet TAKE ONE TABLET BY MOUTH TWICE DAILY  60 tablet  3  . fish oil-omega-3 fatty acids 1000 MG capsule Take 1,000 mg by mouth daily.        . rosuvastatin (CRESTOR) 40 MG tablet Take 40 mg by mouth daily.          Past Medical History  Diagnosis Date  . Tobacco abuse   . History of benign prostatic hypertrophy   . Mixed hyperlipidemia   . Hypertension, benign   . Chest pain, unspecified     non-obstructive CAD (cath 06 and 2/04/24/08, normal LV fxn. 50% proximal LAD, luminal irregularities with 40% proximal circumflex, 25% proximal RCA, 40% ramus.    Past Surgical History  Procedure Date  . Cardiac catheterization 05/14/2008    cath 06 and 2/04/24/08, normal LV fxn. 50% proximal LAD, luminal irregularities with 40% proximal circumflex, 25% proximal RCA, 40% ramus    ROS:  As stated in the HPI and negative for all other systems.  PHYSICAL EXAM BP 132/84  Pulse 72  Resp 16  Ht 5\' 8"  (1.727 m)  Wt 169 lb (76.658 kg)  BMI 25.70 kg/m2 GENERAL:  Well appearing HEENT:  Pupils equal round and reactive, fundi not visualized, oral mucosa unremarkable NECK:  No jugular venous distention, waveform within normal limits, carotid upstroke brisk and symmetric, no bruits, no thyromegaly LYMPHATICS:  No cervical, inguinal adenopathy LUNGS:  Clear to auscultation bilaterally BACK:  No  CVA tenderness CHEST:  Unremarkable HEART:  PMI not displaced or sustained,S1 and S2 within normal limits, no S3, no S4, no clicks, no rubs, no murmurs ABD:  Flat, positive bowel sounds normal in frequency in pitch, no bruits, no rebound, no guarding, no midline pulsatile mass, no hepatomegaly, no splenomegaly EXT:  2 plus pulses throughout, no edema, no cyanosis no clubbing SKIN:  No rashes no nodules NEURO:  Cranial nerves II through XII grossly intact, motor grossly intact throughout PSYCH:  Cognitively intact, oriented to person place and time   EKG:  Normal sinus rhythm, rate 71, poor anterior R-wave progress, ST elevation suggestive of acute injury pattern unchanged from previous EKGs.  ASSESSMENT AND PLAN

## 2010-11-17 NOTE — Progress Notes (Signed)
HPI The patient presents for followup of his known coronary disease. Since I last saw him he has had no new cardiovascular complaints. He does exerting work. With this he does not describe chest pressure, neck or arm discomfort. He does not report palpitations, presyncope or syncope. He continues to smoke a few cigars every week. He is not smoking cigarettes. He denies any new shortness of breath, PND or orthopnea. He is not exercising routinely.  Allergies  Allergen Reactions  .                                                                                   ROS:  As stated in the HPI and negative for all other systems.  PHYSICAL EXAM BP 112/78  Pulse 65  Resp 16  Ht 5\' 8"  (1.727 m)  Wt 179 lb (81.194 kg)  BMI 27.22 kg/m2 GENERAL:  Well appearing HEENT:  Pupils equal round and reactive, fundi not visualized, oral mucosa unremarkable NECK:  No jugular venous distention, waveform within normal limits, carotid upstroke brisk and symmetric, no bruits, no thyromegaly LYMPHATICS:  No cervical, inguinal adenopathy LUNGS:  Clear to auscultation bilaterally BACK:  No CVA tenderness CHEST:  Unremarkable HEART:  PMI not displaced or sustained,S1 and S2 within normal limits, no S3, no S4, no clicks, no rubs, no murmurs ABD:  Flat, positive bowel sounds normal in frequency in pitch, no bruits, no rebound, no guarding, no midline pulsatile mass, no hepatomegaly, no splenomegaly EXT:  2 plus pulses throughout, no edema, no cyanosis no clubbing SKIN:  No rashes no nodules NEURO:  Cranial nerves II through XII grossly intact, motor grossly intact throughout PSYCH:  Cognitively intact, oriented to person place and time  EKG:  Sinus rhythm, rate 71, axis within normal limits, poor anterior R-wave progression with ST segment elevation anteriorly. This has the appearance of an acute injury pattern but it is unchanged over previous EKGs.  ASSESSMENT AND PLAN

## 2010-11-17 NOTE — Patient Instructions (Addendum)
Your physician has requested that you have a lexiscan myoview. For further information please visit [email or web address] Please follow instruction sheet, as given.  Please have fasting lab work the same day as your stress test. (Lipid, liver)  The current medical regimen is effective;  continue present plan and medications.

## 2010-11-17 NOTE — Assessment & Plan Note (Signed)
I will defer to his primary MD.  He says he has had this checked recently.

## 2010-11-25 ENCOUNTER — Other Ambulatory Visit (HOSPITAL_COMMUNITY): Payer: PRIVATE HEALTH INSURANCE | Admitting: Radiology

## 2010-12-01 ENCOUNTER — Ambulatory Visit (HOSPITAL_COMMUNITY): Payer: PRIVATE HEALTH INSURANCE | Attending: Cardiology | Admitting: Radiology

## 2010-12-01 ENCOUNTER — Ambulatory Visit (INDEPENDENT_AMBULATORY_CARE_PROVIDER_SITE_OTHER): Payer: PRIVATE HEALTH INSURANCE | Admitting: *Deleted

## 2010-12-01 DIAGNOSIS — I440 Atrioventricular block, first degree: Secondary | ICD-10-CM

## 2010-12-01 DIAGNOSIS — I252 Old myocardial infarction: Secondary | ICD-10-CM

## 2010-12-01 DIAGNOSIS — R9431 Abnormal electrocardiogram [ECG] [EKG]: Secondary | ICD-10-CM

## 2010-12-01 DIAGNOSIS — I2510 Atherosclerotic heart disease of native coronary artery without angina pectoris: Secondary | ICD-10-CM | POA: Insufficient documentation

## 2010-12-01 DIAGNOSIS — E7800 Pure hypercholesterolemia, unspecified: Secondary | ICD-10-CM

## 2010-12-01 MED ORDER — TECHNETIUM TC 99M TETROFOSMIN IV KIT
11.0000 | PACK | Freq: Once | INTRAVENOUS | Status: AC | PRN
  Administered 2010-12-01: 11 via INTRAVENOUS

## 2010-12-01 MED ORDER — REGADENOSON 0.4 MG/5ML IV SOLN
0.4000 mg | Freq: Once | INTRAVENOUS | Status: AC
  Administered 2010-12-01: 0.4 mg via INTRAVENOUS

## 2010-12-01 MED ORDER — TECHNETIUM TC 99M TETROFOSMIN IV KIT
33.0000 | PACK | Freq: Once | INTRAVENOUS | Status: AC | PRN
  Administered 2010-12-01: 33 via INTRAVENOUS

## 2010-12-01 NOTE — Progress Notes (Signed)
[facility] SITE 3 NUCLEAR MED [address] [place] [address] [phone]  Cardiology Nuclear Med Study  Kevin Burnett is a 60 y.o. male [id number] [birth year]   Nuclear Med Background Indication for Stress Test:  Evaluation for Ischemia History:  7-8 yrs ago GXT; '06 MI (Elevated Enzymes);'06 Echo: EF=50-55%; 2/10 Cath:N/O CAD, EF=60-65% Cardiac Risk Factors:Strong, Premature Family History - CAD, Hypertension, Lipids and Smoker  Symptoms:  Chest Pain, Chest Pressure with and without Exertion ( chronic, CP occurs daily, last yesterday per patient), Dizziness, Fatigue with Exertion, Near Syncope, Palpitations and Rapid HR   Nuclear Pre-Procedure Caffeine/Decaff Intake:  None NPO After: 10:00pm   Lungs:  Clear IV 0.9% NS with Angio Cath:  20g  IV Site: R Antecubital  IV Started by:  Stanton Kidney, EMT-P  Chest Size (in):  42  Cup Size: n/a  Height: 5\' 8"  (1.727 m)  Weight:  164 lb (74.39 kg)  BMI:  Body mass index is 24.94 kg/(m^2). Tech Comments:  NA    Nuclear Med Study 1 or 2 day study: 1 day  Stress Test Type:  Treadmill/Lexiscan  Reading MD: Willa Rough, MD  Order Authorizing Provider:  Rollene Rotunda, MD  Resting Radionuclide: Technetium 27m Tetrofosmin  Resting Radionuclide Dose: 11.0 mCi   Stress Radionuclide:  Technetium 18m Tetrofosmin  Stress Radionuclide Dose: 33.0 mCi           Stress Protocol Rest HR: 53 Stress HR: 95  Rest BP: 143/85 Stress BP: 162/76  Exercise Time (min): 2:00 METS: n/a   Predicted Max HR: 160 bpm % Max HR: 59.38 bpm Rate Pressure Product: 69629   Dose of Adenosine (mg):  n/a Dose of Lexiscan: 0.4 mg  Dose of Atropine (mg): n/a Dose of Dobutamine: n/a mcg/kg/min (at max HR)  Stress Test Technologist: Irean Hong, RN  Nuclear Technologist:  Domenic Polite, CNMT     Rest Procedure:  Myocardial perfusion imaging was performed at rest 45 minutes following the intravenous administration of Technetium 60m  Tetrofosmin. Rest ECG: SB 1st AVB, Old septal Q waves, ST Elevation same as EKG 11/17/10   Stress Procedure:  The patient received IV Lexiscan 0.4 mg over 15-seconds with concurrent low level exercise.  Technetium 45m Tetrofosmin was injected at 30-seconds while the patient continued walking. There were no significant changes with Lexiscan.  Quantitative spect images were obtained after a 45-minute delay. Stress ECG: No significant change from baseline ECG  QPS Raw Data Images:  Patient motion noted; appropriate software correction applied. Stress Images:  Normal homogeneous uptake in all areas of the myocardium. Rest Images:  Normal homogeneous uptake in all areas of the myocardium. Subtraction (SDS):  No evidence of ischemia. Transient Ischemic Dilatation (Normal <1.22):  1.12 Lung/Heart Ratio (Normal <0.45):  0.26  Quantitative Gated Spect Images QGS EDV:  122 ml QGS ESV:  52 ml QGS cine images:  Normal Wall Motion QGS EF: 57%  Impression Exercise Capacity:  Lexiscan with low level exercise. BP Response:  Normal blood pressure response. Clinical Symptoms:  Headache. ECG Impression:  No significant ST segment change suggestive of ischemia. Comparison with Prior Nuclear Study: No previous nuclear study performed  Overall Impression:  Normal stress nuclear study.  Willa Rough

## 2010-12-02 LAB — HEPATIC FUNCTION PANEL
ALT: 13 U/L (ref 0–53)
AST: 23 U/L (ref 0–37)
Albumin: 4.1 g/dL (ref 3.5–5.2)

## 2010-12-02 LAB — LIPID PANEL
HDL: 48.4 mg/dL (ref >39.00)
Total CHOL/HDL Ratio: 3
Triglycerides: 58 mg/dL (ref 0.0–149.0)

## 2010-12-09 ENCOUNTER — Telehealth: Payer: Self-pay | Admitting: Cardiology

## 2010-12-09 NOTE — Telephone Encounter (Signed)
Pt wife calling to find out results of pt stress test. Please call back to discuss further.

## 2010-12-09 NOTE — Telephone Encounter (Signed)
Patient's wife called stress test results given, she verbalized understanding.

## 2011-01-06 ENCOUNTER — Other Ambulatory Visit: Payer: Self-pay | Admitting: Cardiology

## 2011-04-28 ENCOUNTER — Other Ambulatory Visit: Payer: Self-pay | Admitting: Cardiology

## 2011-11-18 ENCOUNTER — Encounter: Payer: Self-pay | Admitting: Cardiology

## 2015-02-12 ENCOUNTER — Emergency Department (HOSPITAL_COMMUNITY): Payer: No Typology Code available for payment source

## 2015-02-12 ENCOUNTER — Encounter (HOSPITAL_COMMUNITY): Payer: Self-pay

## 2015-02-12 ENCOUNTER — Inpatient Hospital Stay (HOSPITAL_COMMUNITY)
Admission: EM | Admit: 2015-02-12 | Discharge: 2015-02-14 | DRG: 087 | Disposition: A | Discharge status: Home / Self Care | Payer: No Typology Code available for payment source | Attending: Neurological Surgery | Admitting: Neurological Surgery

## 2015-02-12 DIAGNOSIS — I10 Essential (primary) hypertension: Secondary | ICD-10-CM | POA: Diagnosis present

## 2015-02-12 DIAGNOSIS — S0101XA Laceration without foreign body of scalp, initial encounter: Secondary | ICD-10-CM | POA: Diagnosis present

## 2015-02-12 DIAGNOSIS — N400 Enlarged prostate without lower urinary tract symptoms: Secondary | ICD-10-CM | POA: Diagnosis present

## 2015-02-12 DIAGNOSIS — Z23 Encounter for immunization: Secondary | ICD-10-CM

## 2015-02-12 DIAGNOSIS — F17290 Nicotine dependence, other tobacco product, uncomplicated: Secondary | ICD-10-CM | POA: Diagnosis present

## 2015-02-12 DIAGNOSIS — Y92410 Unspecified street and highway as the place of occurrence of the external cause: Secondary | ICD-10-CM | POA: Diagnosis not present

## 2015-02-12 DIAGNOSIS — S020XXA Fracture of vault of skull, initial encounter for closed fracture: Principal | ICD-10-CM | POA: Diagnosis present

## 2015-02-12 DIAGNOSIS — T07XXXA Unspecified multiple injuries, initial encounter: Secondary | ICD-10-CM

## 2015-02-12 DIAGNOSIS — S0291XA Unspecified fracture of skull, initial encounter for closed fracture: Secondary | ICD-10-CM | POA: Diagnosis present

## 2015-02-12 DIAGNOSIS — R402412 Glasgow coma scale score 13-15, at arrival to emergency department: Secondary | ICD-10-CM | POA: Diagnosis present

## 2015-02-12 DIAGNOSIS — E782 Mixed hyperlipidemia: Secondary | ICD-10-CM | POA: Diagnosis present

## 2015-02-12 DIAGNOSIS — I2510 Atherosclerotic heart disease of native coronary artery without angina pectoris: Secondary | ICD-10-CM | POA: Diagnosis present

## 2015-02-12 DIAGNOSIS — S06349A Traumatic hemorrhage of right cerebrum with loss of consciousness of unspecified duration, initial encounter: Secondary | ICD-10-CM

## 2015-02-12 LAB — CBC WITH DIFFERENTIAL/PLATELET
Basophils Absolute: 0 10*3/uL (ref 0.0–0.1)
Basophils Relative: 0 %
EOS PCT: 0 %
Eosinophils Absolute: 0 10*3/uL (ref 0.0–0.7)
HCT: 44.2 % (ref 39.0–52.0)
HEMOGLOBIN: 15.3 g/dL (ref 13.0–17.0)
LYMPHS ABS: 1.3 10*3/uL (ref 0.7–4.0)
LYMPHS PCT: 7 %
MCH: 29.1 pg (ref 26.0–34.0)
MCHC: 34.6 g/dL (ref 30.0–36.0)
MCV: 84 fL (ref 78.0–100.0)
MONOS PCT: 5 %
Monocytes Absolute: 1 10*3/uL (ref 0.1–1.0)
Neutro Abs: 16.9 10*3/uL — ABNORMAL HIGH (ref 1.7–7.7)
Neutrophils Relative %: 88 %
PLATELETS: 317 10*3/uL (ref 150–400)
RBC: 5.26 MIL/uL (ref 4.22–5.81)
RDW: 13.4 % (ref 11.5–15.5)
WBC: 19.2 10*3/uL — AB (ref 4.0–10.5)

## 2015-02-12 LAB — I-STAT CHEM 8, ED
BUN: 15 mg/dL (ref 6–20)
CALCIUM ION: 1.15 mmol/L (ref 1.13–1.30)
CHLORIDE: 103 mmol/L (ref 101–111)
Creatinine, Ser: 1.1 mg/dL (ref 0.61–1.24)
GLUCOSE: 118 mg/dL — AB (ref 65–99)
HCT: 48 % (ref 39.0–52.0)
Hemoglobin: 16.3 g/dL (ref 13.0–17.0)
POTASSIUM: 4.4 mmol/L (ref 3.5–5.1)
Sodium: 140 mmol/L (ref 135–145)
TCO2: 25 mmol/L (ref 0–100)

## 2015-02-12 LAB — MRSA PCR SCREENING: MRSA by PCR: NEGATIVE

## 2015-02-12 MED ORDER — SODIUM CHLORIDE 0.9 % IV SOLN: INTRAVENOUS | Status: DC

## 2015-02-12 MED ORDER — POVIDONE-IODINE 10 % EX SOLN
CUTANEOUS | Status: AC
  Filled 2015-02-12: qty 118

## 2015-02-12 MED ORDER — FENTANYL CITRATE (PF) 100 MCG/2ML IJ SOLN
50.0000 ug | INTRAMUSCULAR | Status: AC | PRN
  Administered 2015-02-12 (×2): 50 ug via INTRAVENOUS
  Filled 2015-02-12: qty 2

## 2015-02-12 MED ORDER — ACETAMINOPHEN-CODEINE #3 300-30 MG PO TABS
1.0000 | ORAL_TABLET | ORAL | Status: DC | PRN
  Administered 2015-02-12 – 2015-02-14 (×6): 2 via ORAL
  Filled 2015-02-12 (×6): qty 2

## 2015-02-12 MED ORDER — SODIUM CHLORIDE 0.9 % IV SOLN: INTRAVENOUS | Status: AC

## 2015-02-12 MED ORDER — LIDOCAINE HCL (PF) 1 % IJ SOLN
INTRAMUSCULAR | Status: AC
  Filled 2015-02-12: qty 5

## 2015-02-12 MED ORDER — PNEUMOCOCCAL VAC POLYVALENT 25 MCG/0.5ML IJ INJ
0.5000 mL | INJECTION | INTRAMUSCULAR | Status: AC
  Administered 2015-02-13: 0.5 mL via INTRAMUSCULAR
  Filled 2015-02-12 (×2): qty 0.5

## 2015-02-12 MED ORDER — AMLODIPINE BESYLATE 10 MG PO TABS
10.0000 mg | ORAL_TABLET | Freq: Every day | ORAL | Status: DC
  Administered 2015-02-12 – 2015-02-14 (×3): 10 mg via ORAL
  Filled 2015-02-12 (×3): qty 1

## 2015-02-12 MED ORDER — HYDROMORPHONE HCL 1 MG/ML IJ SOLN: 0.5000 mg | INTRAMUSCULAR | Status: DC | PRN

## 2015-02-12 MED ORDER — IOHEXOL 300 MG/ML  SOLN
100.0000 mL | Freq: Once | INTRAMUSCULAR | Status: AC | PRN
  Administered 2015-02-12: 100 mL via INTRAVENOUS

## 2015-02-12 MED ORDER — ACETAMINOPHEN 325 MG PO TABS: 650.0000 mg | ORAL_TABLET | ORAL | Status: DC | PRN

## 2015-02-12 MED ORDER — ACETAMINOPHEN 650 MG RE SUPP: 650.0000 mg | RECTAL | Status: DC | PRN

## 2015-02-12 MED ORDER — CARVEDILOL 6.25 MG PO TABS
6.2500 mg | ORAL_TABLET | Freq: Two times a day (BID) | ORAL | Status: DC
  Administered 2015-02-12 – 2015-02-14 (×4): 6.25 mg via ORAL
  Filled 2015-02-12 (×4): qty 1

## 2015-02-12 MED ORDER — SENNOSIDES-DOCUSATE SODIUM 8.6-50 MG PO TABS
1.0000 | ORAL_TABLET | Freq: Two times a day (BID) | ORAL | Status: DC
  Administered 2015-02-12 – 2015-02-14 (×4): 1 via ORAL
  Filled 2015-02-12 (×4): qty 1

## 2015-02-12 MED ORDER — ONDANSETRON HCL 4 MG/2ML IJ SOLN: 4.0000 mg | Freq: Three times a day (TID) | INTRAMUSCULAR | Status: DC | PRN

## 2015-02-12 MED ORDER — SODIUM CHLORIDE 0.9 % IV SOLN
INTRAVENOUS | Status: DC
  Administered 2015-02-12 (×2): via INTRAVENOUS

## 2015-02-12 MED ORDER — TETANUS-DIPHTH-ACELL PERTUSSIS 5-2.5-18.5 LF-MCG/0.5 IM SUSP
0.5000 mL | Freq: Once | INTRAMUSCULAR | Status: AC
  Administered 2015-02-12: 0.5 mL via INTRAMUSCULAR
  Filled 2015-02-12: qty 0.5

## 2015-02-12 MED ORDER — FENTANYL CITRATE (PF) 100 MCG/2ML IJ SOLN
INTRAMUSCULAR | Status: AC
  Administered 2015-02-12: 50 ug via INTRAVENOUS
  Filled 2015-02-12: qty 2

## 2015-02-12 MED ORDER — MORPHINE SULFATE (PF) 2 MG/ML IV SOLN
1.0000 mg | INTRAVENOUS | Status: DC | PRN
  Administered 2015-02-12 – 2015-02-13 (×3): 2 mg via INTRAVENOUS
  Filled 2015-02-12 (×3): qty 1

## 2015-02-12 MED ORDER — PANTOPRAZOLE SODIUM 40 MG IV SOLR
40.0000 mg | Freq: Every day | INTRAVENOUS | Status: DC
  Administered 2015-02-12: 40 mg via INTRAVENOUS
  Filled 2015-02-12: qty 40

## 2015-02-12 MED ORDER — FENTANYL CITRATE (PF) 100 MCG/2ML IJ SOLN
50.0000 ug | INTRAMUSCULAR | Status: DC | PRN
  Administered 2015-02-12: 50 ug via INTRAVENOUS
  Filled 2015-02-12: qty 2

## 2015-02-12 MED ORDER — INFLUENZA VAC SPLIT QUAD 0.5 ML IM SUSY
0.5000 mL | PREFILLED_SYRINGE | INTRAMUSCULAR | Status: AC
  Administered 2015-02-13: 0.5 mL via INTRAMUSCULAR
  Filled 2015-02-12: qty 0.5
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
## 2015-02-12 NOTE — ED Provider Notes (Signed)
   This is a shared visit note.  I was asked by the attending physician, Dr. Clarene DukeMcManus, to repair the patient's lacerations.  This was my only involvement in this patient's care.    Patient has 1 cm flap laceration to the left scalp and 2 cm laceration to the right hairline area of the scalp with mild bleeding, controlled with pressure.  Pt admits to me that he takes ASA daily   LACERATION REPAIR  #1 Performed by: Kweku Stankey L. Authorized by: Maxwell CaulRIPLETT,Parris Cudworth L. Consent: Verbal consent obtained. Risks and benefits: risks, benefits and alternatives were discussed Consent given by: patient Patient identity confirmed: provided demographic data Prepped and Draped in normal sterile fashion Wound explored  Laceration Location: left parietal scalp  Laceration Length: 1cm  No Foreign Bodies seen or palpated  Anesthesia: local infiltration  Local anesthetic: lidocaine 1 % w/o epinephrine  Anesthetic total: 1 ml  Irrigation method: syringe Amount of cleaning: standard  Skin closure: staple  Number of sutures: 1  Technique: stapling  Patient tolerance: Patient tolerated the procedure well with no immediate complications.     LACERATION REPAIR  #2  Performed by: Marks Scalera L. Authorized by: Maxwell CaulRIPLETT,Rishon Thilges L. Consent: Verbal consent obtained. Risks and benefits: risks, benefits and alternatives were discussed Consent given by: patient Patient identity confirmed: provided demographic data Prepped and Draped in normal sterile fashion Wound explored  Laceration Location: hairline of the right scalp  Laceration Length: 2 cm  No Foreign Bodies seen or palpated  Anesthesia: local infiltration  Local anesthetic: lidocaine 1 % w/o epinephrine  Anesthetic total: 3 ml  Irrigation method: syringe Amount of cleaning: standard  Skin closure: 4-0 prolene  Number of sutures: 4  Technique: simple interrupted  Patient tolerance: Patient tolerated the procedure well with  no immediate complications.   Bleeding controlled.  Wound edges well approximated.  Td given   Pauline Ausammy Adalai Perl, PA-C 02/12/15 1501  Samuel JesterKathleen McManus, DO 02/13/15 2128
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
## 2015-02-13 NOTE — Progress Notes (Signed)
Report called, assessment reviewed. Family at bedside aware of transfer.

## 2015-02-13 NOTE — Progress Notes (Signed)
Walked in hallway 200 ft x 2,  Gait steady, no SOB with exertion.  Headache controlled with Tylenol # 3.

## 2015-02-13 NOTE — Care Management (Signed)
Initial UR completed. Jasper Ruminski RN BSN  

## 2015-02-13 NOTE — Progress Notes (Signed)
Patient ID: Kevin Burnett, male   DOB: 11-30-1950, 64 y.o.   MRN: [id number] He looks really good this morning. He has some moderate headache. Moderate right-sided chest pain. Has not been out of bed. Moves all extremities equally. Follows commands. Answers questions appropriately. We'll mobilize today. Transfer to the floor today. Hopefully home over the weekend.

## 2015-02-13 NOTE — Progress Notes (Signed)
PT IS TRANSFERED TO 5M06 FROM 36M

## 2015-02-14 MED ORDER — ACETAMINOPHEN-CODEINE #3 300-30 MG PO TABS: 1.0000 | ORAL_TABLET | ORAL | Status: DC | PRN
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
## 2015-02-14 NOTE — Progress Notes (Signed)
scharge orders received, Pt for discharge home today. IV d/c'd. D/c instructions and RX given with verbalized understanding. Family at bedside to assist patient with discharge. Staff bought pt downstairs via wheelchair. 1032 02/14/15
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
## 2015-04-24 ENCOUNTER — Other Ambulatory Visit: Payer: Self-pay | Admitting: Neurological Surgery

## 2015-04-24 DIAGNOSIS — S0990XD Unspecified injury of head, subsequent encounter: Secondary | ICD-10-CM

## 2015-05-01 ENCOUNTER — Ambulatory Visit
Admission: RE | Admit: 2015-05-01 | Discharge: 2015-05-01 | Disposition: A | Discharge status: Home / Self Care | Payer: BLUE CROSS/BLUE SHIELD | Source: Ambulatory Visit | Attending: Neurological Surgery | Admitting: Neurological Surgery

## 2015-05-01 DIAGNOSIS — S0990XD Unspecified injury of head, subsequent encounter: Secondary | ICD-10-CM

## 2017-03-19 ENCOUNTER — Encounter (HOSPITAL_COMMUNITY): Payer: Self-pay | Admitting: Emergency Medicine

## 2017-03-19 ENCOUNTER — Emergency Department (HOSPITAL_COMMUNITY): Payer: Medicare HMO

## 2017-03-19 ENCOUNTER — Other Ambulatory Visit: Payer: Self-pay

## 2017-03-19 ENCOUNTER — Observation Stay (HOSPITAL_COMMUNITY)
Admission: EM | Admit: 2017-03-19 | Discharge: 2017-03-21 | Disposition: A | Discharge status: Home / Self Care | Payer: Medicare HMO | Attending: Internal Medicine | Admitting: Internal Medicine

## 2017-03-19 DIAGNOSIS — E785 Hyperlipidemia, unspecified: Secondary | ICD-10-CM | POA: Diagnosis not present

## 2017-03-19 DIAGNOSIS — Z23 Encounter for immunization: Secondary | ICD-10-CM | POA: Insufficient documentation

## 2017-03-19 DIAGNOSIS — I2510 Atherosclerotic heart disease of native coronary artery without angina pectoris: Secondary | ICD-10-CM | POA: Diagnosis present

## 2017-03-19 DIAGNOSIS — R079 Chest pain, unspecified: Secondary | ICD-10-CM | POA: Diagnosis present

## 2017-03-19 DIAGNOSIS — I2699 Other pulmonary embolism without acute cor pulmonale: Secondary | ICD-10-CM | POA: Insufficient documentation

## 2017-03-19 DIAGNOSIS — I10 Essential (primary) hypertension: Secondary | ICD-10-CM | POA: Diagnosis not present

## 2017-03-19 DIAGNOSIS — I82402 Acute embolism and thrombosis of unspecified deep veins of left lower extremity: Secondary | ICD-10-CM | POA: Diagnosis present

## 2017-03-19 DIAGNOSIS — F17200 Nicotine dependence, unspecified, uncomplicated: Secondary | ICD-10-CM | POA: Diagnosis present

## 2017-03-19 DIAGNOSIS — F17290 Nicotine dependence, other tobacco product, uncomplicated: Secondary | ICD-10-CM | POA: Insufficient documentation

## 2017-03-19 DIAGNOSIS — Z7982 Long term (current) use of aspirin: Secondary | ICD-10-CM | POA: Insufficient documentation

## 2017-03-19 DIAGNOSIS — Z79899 Other long term (current) drug therapy: Secondary | ICD-10-CM | POA: Insufficient documentation

## 2017-03-19 LAB — BASIC METABOLIC PANEL
ANION GAP: 11 (ref 5–15)
BUN: 14 mg/dL (ref 6–20)
CHLORIDE: 101 mmol/L (ref 101–111)
CO2: 20 mmol/L — AB (ref 22–32)
CREATININE: 1.32 mg/dL — AB (ref 0.61–1.24)
Calcium: 8.8 mg/dL — ABNORMAL LOW (ref 8.9–10.3)
GFR calc Af Amer: >60 mL/min (ref >60)
GFR calc non Af Amer: 55 mL/min — ABNORMAL LOW (ref >60)
GLUCOSE: 127 mg/dL — AB (ref 65–99)
Potassium: 3.7 mmol/L (ref 3.5–5.1)
Sodium: 132 mmol/L — ABNORMAL LOW (ref 135–145)

## 2017-03-19 LAB — HEPARIN LEVEL (UNFRACTIONATED): HEPARIN UNFRACTIONATED: 0.51 [IU]/mL (ref 0.30–0.70)

## 2017-03-19 LAB — PROTIME-INR
INR: 1.08
PROTHROMBIN TIME: 13.9 s (ref 11.4–15.2)

## 2017-03-19 LAB — CBC
HCT: 40.1 % (ref 39.0–52.0)
HEMOGLOBIN: 13 g/dL (ref 13.0–17.0)
MCH: 27.3 pg (ref 26.0–34.0)
MCHC: 32.4 g/dL (ref 30.0–36.0)
MCV: 84.1 fL (ref 78.0–100.0)
Platelets: 193 10*3/uL (ref 150–400)
RBC: 4.77 MIL/uL (ref 4.22–5.81)
RDW: 13.6 % (ref 11.5–15.5)
WBC: 10.1 10*3/uL (ref 4.0–10.5)

## 2017-03-19 LAB — TROPONIN I
TROPONIN I: 0.03 ng/mL — AB (ref <0.03)
TROPONIN I: 0.03 ng/mL — AB (ref <0.03)

## 2017-03-19 LAB — APTT: aPTT: 37 seconds — ABNORMAL HIGH (ref 24–36)

## 2017-03-19 LAB — D-DIMER, QUANTITATIVE: D-Dimer, Quant: 9.69 ug/mL-FEU — ABNORMAL HIGH (ref 0.00–0.50)

## 2017-03-19 MED ORDER — ONDANSETRON HCL 4 MG/2ML IJ SOLN: 4.0000 mg | Freq: Four times a day (QID) | INTRAMUSCULAR | Status: DC | PRN

## 2017-03-19 MED ORDER — HEPARIN BOLUS VIA INFUSION
5000.0000 [IU] | Freq: Once | INTRAVENOUS | Status: AC
  Administered 2017-03-19: 5000 [IU] via INTRAVENOUS

## 2017-03-19 MED ORDER — HYDROCODONE-ACETAMINOPHEN 5-325 MG PO TABS
1.0000 | ORAL_TABLET | Freq: Four times a day (QID) | ORAL | Status: DC | PRN
  Administered 2017-03-19 – 2017-03-21 (×4): 1 via ORAL
  Filled 2017-03-19 (×4): qty 1

## 2017-03-19 MED ORDER — ONDANSETRON HCL 4 MG PO TABS: 4.0000 mg | ORAL_TABLET | Freq: Four times a day (QID) | ORAL | Status: DC | PRN

## 2017-03-19 MED ORDER — AMLODIPINE BESYLATE 5 MG PO TABS
10.0000 mg | ORAL_TABLET | Freq: Every day | ORAL | Status: DC
  Administered 2017-03-20 – 2017-03-21 (×2): 10 mg via ORAL
  Filled 2017-03-19 (×2): qty 2

## 2017-03-19 MED ORDER — IOPAMIDOL (ISOVUE-370) INJECTION 76%
80.0000 mL | Freq: Once | INTRAVENOUS | Status: AC | PRN
  Administered 2017-03-19: 80 mL via INTRAVENOUS

## 2017-03-19 MED ORDER — ACETAMINOPHEN 650 MG RE SUPP: 650.0000 mg | Freq: Four times a day (QID) | RECTAL | Status: DC | PRN

## 2017-03-19 MED ORDER — CARVEDILOL 3.125 MG PO TABS
6.2500 mg | ORAL_TABLET | Freq: Two times a day (BID) | ORAL | Status: DC
  Administered 2017-03-20 – 2017-03-21 (×2): 6.25 mg via ORAL
  Filled 2017-03-19 (×2): qty 2

## 2017-03-19 MED ORDER — HEPARIN (PORCINE) IN NACL 100-0.45 UNIT/ML-% IJ SOLN
1200.0000 [IU]/h | INTRAMUSCULAR | Status: DC
  Administered 2017-03-19 – 2017-03-21 (×3): 1200 [IU]/h via INTRAVENOUS
  Filled 2017-03-19 (×3): qty 250

## 2017-03-19 MED ORDER — HEPARIN SODIUM (PORCINE) 5000 UNIT/ML IJ SOLN: 4000.0000 [IU] | Freq: Once | INTRAMUSCULAR | Status: DC

## 2017-03-19 MED ORDER — ACETAMINOPHEN 325 MG PO TABS
650.0000 mg | ORAL_TABLET | Freq: Four times a day (QID) | ORAL | Status: DC | PRN
  Administered 2017-03-19 – 2017-03-20 (×2): 650 mg via ORAL
  Filled 2017-03-19 (×2): qty 2

## 2017-03-19 MED ORDER — ATORVASTATIN CALCIUM 40 MG PO TABS
40.0000 mg | ORAL_TABLET | Freq: Every day | ORAL | Status: DC
  Administered 2017-03-20 – 2017-03-21 (×2): 40 mg via ORAL
  Filled 2017-03-19 (×2): qty 1

## 2017-03-19 MED ORDER — BENAZEPRIL HCL 10 MG PO TABS
20.0000 mg | ORAL_TABLET | Freq: Every day | ORAL | Status: DC
  Administered 2017-03-20 – 2017-03-21 (×2): 20 mg via ORAL
  Filled 2017-03-19 (×2): qty 2
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
## 2017-03-19 NOTE — ED Notes (Signed)
Report given to Angie, RN on 300.

## 2017-03-19 NOTE — H&P (Signed)
History and Physical    Kevin Burnett VWU:[id number] DOB: Oct 16, 1950 DOA: 03/19/2017  PCP: Copper, Landis Gandy, PA-C  Patient coming from: Home.   I have personally briefly reviewed patient's old medical records in [facility]  Chief Complaint: shortness of breath and left sided chest pain.   HPI: Kevin Burnett is a 66 y.o. male with medical history significant of non obstructive CAD, HYPERTENSION, hyperlipidemia. Tobacco abuse comes in for chest pain in th eleft lateral aspect of the chest, radiating to the arm pit, associated with sob and cough since 3 days. He denies any fever or chills, no nausea, vomiting or abd pain. He denies dizziness, headache, or sensory deficits. On arrival to ED, he was found to have elevated D DIMER , which was followed by CT angio of the chest is positive for for central left pulmonary artery pulmonary Thromboembolism. He was started on IV heparin and referred to medical service for admission for further evaluation.     Review of Systems: As per HPI otherwise all others negative.   Past Medical History:  Diagnosis Date  . Chest pain, unspecified    non-obstructive CAD (cath 06 and 2/04/24/08, normal LV fxn. 50% proximal LAD, luminal irregularities with 40% proximal circumflex, 25% proximal RCA, 40% ramus.  . History of benign prostatic hypertrophy   . Hypertension, benign   . Mixed hyperlipidemia   . Tobacco abuse     Past Surgical History:  Procedure Laterality Date  . CARDIAC CATHETERIZATION  05/14/2008   cath 06 and 2/04/24/08, normal LV fxn. 50% proximal LAD, luminal irregularities with 40% proximal circumflex, 25% proximal RCA, 40% ramus     reports that he has been smoking cigars.  he has never used smokeless tobacco. He reports that he does not drink alcohol or use drugs.  Allergies  Allergen Reactions  . Lipitor [Atorvastatin Calcium]     Family History  Problem Relation Age of Onset  . Hypertension Neg Hx   . Diabetes Neg Hx   .  Coronary artery disease Neg Hx      Family history reviewed and not pertinent   Prior to Admission medications   Medication Sig Start Date End Date Taking? Authorizing Provider  amLODipine (NORVASC) 10 MG tablet TAKE ONE TABLET BY MOUTH EVERY DAY 01/06/11  Yes Rollene Rotunda, MD  aspirin 81 MG tablet Take 81 mg by mouth daily.     Yes [provider]  atorvastatin (LIPITOR) 40 MG tablet Take 40 mg by mouth daily. 01/25/17  Yes [provider]  benazepril (LOTENSIN) 20 MG tablet Take 20 mg by mouth daily. 05/27/16 05/27/17 Yes [provider]  carvedilol (COREG) 6.25 MG tablet TAKE ONE TABLET BY MOUTH TWICE DAILY 07/26/10  Yes Hochrein, Fayrene Fearing, MD  Phenyleph-Doxylamine-DM-APAP (ALKA-SELTZER PLS ALLERGY & [facility] PO) Take 1 Dose by mouth daily as needed (cough).   Yes [provider]  CRESTOR 40 MG tablet TAKE ONE TABLET BY MOUTH EVERY DAY Patient not taking: Reported on 03/19/2017 04/28/11   Rollene Rotunda, MD    Physical Exam: Vitals:   03/19/17 1032 03/19/17 1100 03/19/17 1130 03/19/17 1230  BP:  123/72 119/75 122/75  Pulse:  (!) 59 60 61  Resp:  19 (!) 21 (!) 23  Temp:   98.7 F (37.1 C)   TempSrc:   Oral   SpO2:  97% 97% 94%  Weight: 73.5 kg (162 lb)     Height: 5\' 8"  (1.727 m)  Constitutional: NAD, calm, comfortable Vitals:   03/19/17 1032 03/19/17 1100 03/19/17 1130 03/19/17 1230  BP:  123/72 119/75 122/75  Pulse:  (!) 59 60 61  Resp:  19 (!) 21 (!) 23  Temp:   98.7 F (37.1 C)   TempSrc:   Oral   SpO2:  97% 97% 94%  Weight: 73.5 kg (162 lb)     Height: 5\' 8"  (1.727 m)      Eyes: PERRL, lids and conjunctivae normal ENMT: Mucous membranes are moist. Posterior pharynx clear of any exudate or lesions.Normal dentition.  Neck: normal, supple, no masses, no thyromegaly Respiratory: clear to auscultation bilaterally, no wheezing, no crackles. Normal respiratory effort. No accessory muscle use.  Cardiovascular: Regular rate and rhythm, no  murmurs / rubs / gallops. No extremity edema. 2+ pedal pulses. No carotid bruits.  Abdomen: no tenderness, no masses palpated. No hepatosplenomegaly. Bowel sounds positive.  Musculoskeletal: no clubbing / cyanosis. No joint deformity upper and lower extremities. Good ROM, no contractures. Normal muscle tone.  Skin: no rashes, lesions, ulcers. No induration Neurologic: CN 2-12 grossly intact. Sensation intact, DTR normal. Strength 5/5 in all 4.  Psychiatric: Normal judgment and insight. Alert and oriented x 3. Normal mood.     Labs on Admission: I have personally reviewed following labs and imaging studies  CBC: Recent Labs  Lab 03/19/17 1037  WBC 10.1  HGB 13.0  HCT 40.1  MCV 84.1  PLT 193   Basic Metabolic Panel: Recent Labs  Lab 03/19/17 1037  NA 132*  K 3.7  CL 101  CO2 20*  GLUCOSE 127*  BUN 14  CREATININE 1.32*  CALCIUM 8.8*   GFR: Estimated Creatinine Clearance: 53.3 mL/min (A) (by C-G formula based on SCr of 1.32 mg/dL (H)). Liver Function Tests: No results for input(s): AST, ALT, ALKPHOS, BILITOT, PROT, ALBUMIN in the last 168 hours. No results for input(s): LIPASE, AMYLASE in the last 168 hours. No results for input(s): AMMONIA in the last 168 hours. Coagulation Profile: No results for input(s): INR, PROTIME in the last 168 hours. Cardiac Enzymes: Recent Labs  Lab 03/19/17 1037  TROPONINI 0.03*   BNP (last 3 results) No results for input(s): PROBNP in the last 8760 hours. HbA1C: No results for input(s): HGBA1C in the last 72 hours. CBG: No results for input(s): GLUCAP in the last 168 hours. Lipid Profile: No results for input(s): CHOL, HDL, LDLCALC, TRIG, CHOLHDL, LDLDIRECT in the last 72 hours. Thyroid Function Tests: No results for input(s): TSH, T4TOTAL, FREET4, T3FREE, THYROIDAB in the last 72 hours. Anemia Panel: No results for input(s): VITAMINB12, FOLATE, FERRITIN, TIBC, IRON, RETICCTPCT in the last 72 hours. Urine analysis: No results  found for: COLORURINE, APPEARANCEUR, LABSPEC, PHURINE, GLUCOSEU, HGBUR, BILIRUBINUR, KETONESUR, PROTEINUR, UROBILINOGEN, NITRITE, LEUKOCYTESUR  Radiological Exams on Admission: Dg Chest 2 View  Result Date: 03/19/2017 CLINICAL DATA:  Chest pain EXAM: CHEST  2 VIEW COMPARISON:  02/12/2015 FINDINGS: The heart size and mediastinal contours are within normal limits. Both lungs are clear. The visualized skeletal structures are unremarkable. IMPRESSION: No active cardiopulmonary disease. Electronically Signed   By: Taylor  Stroud M.D.   On: 03/19/2017 11:24   Ct Angio Chest Pe W And/or Wo Contrast  Result Date: 03/19/2017 CLINICAL DATA:  Chest pain EXAM: CT ANGIOGRAPHY CHEST WITH CONTRAST TECHNIQUE: Multidetector CT imaging of the chest was performed using the standard protocol during bolus administration of intravenous contrast. Multiplanar CT image reconstructions and MIPs were obtained to evaluate the vascular anatomy. CONTRAST:  32mL  ISOVUE-370 IOPAMIDOL (ISOVUE-370) INJECTION 76% COMPARISON:  02/12/2015 FINDINGS: Cardiovascular: There is serpiginous low-density filling defect in the left main pulmonary artery extending into left upper and lower lobe lobar and segmental branches. There are no filling defects in the right pulmonary arterial tree. There is no evidence of right heart strain. Moderate 3 vessel coronary artery calcification. No evidence of aortic dissection. Atherosclerotic calcifications of the aortic arch. Mediastinum/Nodes: No abnormal mediastinal adenopathy. No pericardial effusion. Lungs/Pleura: No pneumothorax. Tiny left pleural effusion. Dependent atelectasis bilaterally. Heterogeneous opacities are present in the superior segment of the left lower lobe with a peripheral wedge-shaped appearance worrisome for developing infarct. See image 70 of series 6. Upper Abdomen: Nonspecific calcification in the left adrenal gland. Simple cyst in the left kidney. Musculoskeletal: No vertebral  compression deformity. Review of the MIP images confirms the above findings. IMPRESSION: Study is positive for central left pulmonary artery pulmonary thromboembolism. Critical Value/emergent results were called by telephone at the time of interpretation on 03/19/2017 at 2:10 pm to Dr. Donnetta HutchingBRIAN COOK , who verbally acknowledged these results. Developing pulmonary infarct in the superior segment of the left lower lobe. Aortic Atherosclerosis (ICD10-I70.0). Electronically Signed   By: Jolaine ClickArthur  Hoss M.D.   On: 03/19/2017 14:11    EKG: Independently reviewed. Sinus rhythm   Assessment/Plan Active Problems:   Pulmonary embolism (HCC)    Central PE; Unprovoked. Coagulation labs not sent prior to starting heparin.  Admit to telemetry. Get serial troponin's as he has chest pain.  Started on IV heparin. Change to eliquis in am if his insurance covers it.  Get echo to evaluate for right heart strain.     Hypertension:  Well controlled.    Tobacco abuse:  On nicotine patch.   CAD: Left sided chest pain on admission, improved now.  On aspirin at home.  Get serial troponins and echo.   Hyperlipidemia:  Resume statin.    DVT prophylaxis: heparin.  Code Status: full code.  Family Communication: family at bedside.  Disposition Plan: possible d./c home in am.  Consults called: none.  Admission status: tele /obs.    Kathlen ModyVijaya Lucien Budney MD [facility] Pager [phone]   If 7PM-7AM, please contact night-coverage [email or web address] Password [email or web address]  03/19/2017, 2:36 PM

## 2017-03-19 NOTE — ED Triage Notes (Signed)
Patient c/o left side chest pain that radiates into left arm x2 days. Per patient shortness of breath and back pain. 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 Patient denies taking any aspirin or nitro. Per patient "hacky cough without fever, increased pain with laying down and taking a deep breath.

## 2017-03-19 NOTE — ED Provider Notes (Addendum)
[facility]Y [facility]PART[facility] Provider Note   CSN: [id number] Arrival date & time: 03/19/17  1027     History   Chief Complaint Chief Complaint  Patient presents with  . Chest Pain    HPI Kevin Burnett is a 66 y.o. male.  Left-sided chest pain with radiation to the upper back and associated profound dyspnea for 2-3 days.  Prodromal URI symptoms with a cough.  Past medical history includes myocardial infarction x3 different episodes, hypertension, hypercholesterolemia.  His symptoms worsen with ambulation.  Severity of symptoms is moderate.  No history of pulmonary embolism.      Past Medical History:  Diagnosis Date  . Chest pain, unspecified    non-obstructive CAD (cath 06 and 2/04/24/08, normal LV fxn. 50% proximal LAD, luminal irregularities with 40% proximal circumflex, 25% proximal RCA, 40% ramus.  . History of benign prostatic hypertrophy   . Hypertension, benign   . Mixed hyperlipidemia   . Tobacco abuse     Patient Active Problem List   Diagnosis Date Noted  . Depressed skull fracture (HCC) 02/12/2015  . Skull fracture (HCC) 02/12/2015  . CAD 07/30/2008  . HYPERLIPIDEMIA-MIXED 05/28/2008  . TOBACCO ABUSE 05/28/2008  . HYPERTENSION, BENIGN 05/28/2008  . ORTHOSTATIC DIZZINESS 05/28/2008  . CHEST PAIN-UNSPECIFIED 05/28/2008  . BENIGN PROSTATIC HYPERTROPHY, HX OF 05/28/2008    Past Surgical History:  Procedure Laterality Date  . CARDIAC CATHETERIZATION  05/14/2008   cath 06 and 2/04/24/08, normal LV fxn. 50% proximal LAD, luminal irregularities with 40% proximal circumflex, 25% proximal RCA, 40% ramus       Home Medications    Prior to Admission medications   Medication Sig Start Date End Date Taking? Authorizing Provider  amLODipine (NORVASC) 10 MG tablet TAKE ONE TABLET BY MOUTH EVERY DAY 01/06/11  Yes Rollene RotundaHochrein, James, MD  aspirin 81 MG tablet Take 81 mg by mouth daily.     Yes [provider]  atorvastatin (LIPITOR) 40 MG tablet Take 40 mg  by mouth daily. 01/25/17  Yes [provider]  benazepril (LOTENSIN) 20 MG tablet Take 20 mg by mouth daily. 05/27/16 05/27/17 Yes [provider]  carvedilol (COREG) 6.25 MG tablet TAKE ONE TABLET BY MOUTH TWICE DAILY 07/26/10  Yes Hochrein, Fayrene FearingJames, MD  Phenyleph-Doxylamine-DM-APAP (ALKA-SELTZER PLS ALLERGY & [facility] PO) Take 1 Dose by mouth daily as needed (cough).   Yes [provider]  CRESTOR 40 MG tablet TAKE ONE TABLET BY MOUTH EVERY DAY Patient not taking: Reported on 03/19/2017 04/28/11   Rollene RotundaHochrein, James, MD    Family History Family History  Problem Relation Age of Onset  . Hypertension Neg Hx   . Diabetes Neg Hx   . Coronary artery disease Neg Hx     Social History Social History   Tobacco Use  . Smoking status: Current Every Day Smoker    Types: Cigars  . Smokeless tobacco: Never Used  . Tobacco comment: Smokes 1 cigar/ day for past 4 years  Substance Use Topics  . Alcohol use: No  . Drug use: No    Comment: Smoked heavy marijuana for >30 years- former     Allergies   Lipitor [atorvastatin calcium]   Review of Systems Review of Systems  All other systems reviewed and are negative.    Physical Exam Updated Vital Signs BP 122/75   Pulse 61   Temp 98.7 F (37.1 C) (Oral)   Resp (!) 23   Ht 5\' 8"  (1.727 m)   Wt 73.5 kg (162  lb)   SpO2 94%   BMI 24.63 kg/m   Physical Exam  Constitutional: He is oriented to person, place, and time. He appears well-developed and well-nourished.  HENT:  Head: Normocephalic and atraumatic.  Eyes: Conjunctivae are normal.  Neck: Neck supple.  Cardiovascular: Normal rate and regular rhythm.  Pulmonary/Chest: Effort normal and breath sounds normal.  Abdominal: Soft. Bowel sounds are normal.  Musculoskeletal: Normal range of motion.  Neurological: He is alert and oriented to person, place, and time.  Skin: Skin is warm and dry.  Psychiatric: He has a normal mood and affect. His behavior is normal.  Nursing  note and vitals reviewed.    ED Treatments / Results  Labs (all labs ordered are listed, but only abnormal results are displayed) Labs Reviewed  BASIC METABOLIC PANEL - Abnormal; Notable for the following components:      Result Value   Sodium 132 (*)    CO2 20 (*)    Glucose, Bld 127 (*)    Creatinine, Ser 1.32 (*)    Calcium 8.8 (*)    GFR calc non Af Amer 55 (*)    All other components within normal limits  TROPONIN I - Abnormal; Notable for the following components:   Troponin I 0.03 (*)    All other components within normal limits  D-DIMER, QUANTITATIVE (NOT AT [facility]) - Abnormal; Notable for the following components:   D-Dimer, Quant 9.69 (*)    All other components within normal limits  CBC  APTT  PROTIME-INR  HEPARIN LEVEL (UNFRACTIONATED)    EKG  EKG Interpretation  Date/Time:  Sunday March 19 2017 10:34:40 EST Ventricular Rate:  66 PR Interval:    QRS Duration: 98 QT Interval:  417 QTC Calculation: 437 R Axis:   -26 Text Interpretation:  Sinus rhythm Abnormal R-wave progression, early transition Left ventricular hypertrophy Confirmed by Donnetta Hutchingook, Hosey Burmester ([id number]) on 03/19/2017 12:30:48 PM       Radiology Dg Chest 2 View  Result Date: 03/19/2017 CLINICAL DATA:  Chest pain EXAM: CHEST  2 VIEW COMPARISON:  02/12/2015 FINDINGS: The heart size and mediastinal contours are within normal limits. Both lungs are clear. The visualized skeletal structures are unremarkable. IMPRESSION: No active cardiopulmonary disease. Electronically Signed   By: Signa Kellaylor  Stroud M.D.   On: 03/19/2017 11:24   Ct Angio Chest Pe W And/or Wo Contrast  Result Date: 03/19/2017 CLINICAL DATA:  Chest pain EXAM: CT ANGIOGRAPHY CHEST WITH CONTRAST TECHNIQUE: Multidetector CT imaging of the chest was performed using the standard protocol during bolus administration of intravenous contrast. Multiplanar CT image reconstructions and MIPs were obtained to evaluate the vascular anatomy. CONTRAST:  80mL  ISOVUE-370 IOPAMIDOL (ISOVUE-370) INJECTION 76% COMPARISON:  02/12/2015 FINDINGS: Cardiovascular: There is serpiginous low-density filling defect in the left main pulmonary artery extending into left upper and lower lobe lobar and segmental branches. There are no filling defects in the right pulmonary arterial tree. There is no evidence of right heart strain. Moderate 3 vessel coronary artery calcification. No evidence of aortic dissection. Atherosclerotic calcifications of the aortic arch. Mediastinum/Nodes: No abnormal mediastinal adenopathy. No pericardial effusion. Lungs/Pleura: No pneumothorax. Tiny left pleural effusion. Dependent atelectasis bilaterally. Heterogeneous opacities are present in the superior segment of the left lower lobe with a peripheral wedge-shaped appearance worrisome for developing infarct. See image 70 of series 6. Upper Abdomen: Nonspecific calcification in the left adrenal gland. Simple cyst in the left kidney. Musculoskeletal: No vertebral compression deformity. Review of the MIP images confirms the above findings.  IMPRESSION: Study is positive for central left pulmonary artery pulmonary thromboembolism. Critical Value/emergent results were called by telephone at the time of interpretation on 03/19/2017 at 2:10 pm to Dr. Donnetta Hutching , who verbally acknowledged these results. Developing pulmonary infarct in the superior segment of the left lower lobe. Aortic Atherosclerosis (ICD10-I70.0). Electronically Signed   By: Jolaine Click M.D.   On: 03/19/2017 14:11    Procedures Procedures (including critical care time)  Medications Ordered in ED Medications  heparin bolus via infusion 5,000 Units (not administered)  heparin ADULT infusion 100 units/mL (25000 units/245mL sodium chloride 0.45%) (not administered)  iopamidol (ISOVUE-370) 76 % injection 80 mL (80 mLs Intravenous Contrast Given 03/19/17 1332)     Initial Impression / Assessment and Plan / ED Course  I have reviewed the  triage vital signs and the nursing notes.  Pertinent labs & imaging results that were available during my care of the patient were reviewed by me and considered in my medical decision making (see chart for details).     Patient is hemodynamically stable.  CT angiogram of the chest reveals a pulmonary embolism.  Will initiate heparin therapy.  Admit to general medicine.  CRITICAL CARE Performed by: Donnetta Hutching  ?  Total critical care time: 30 minutes  Critical care time was exclusive of separately billable procedures and treating other patients.  Critical care was necessary to treat or prevent imminent or life-threatening deterioration.  Critical care was time spent personally by me on the following activities: development of treatment plan with patient and/or surrogate as well as nursing, discussions with consultants, evaluation of patient's response to treatment, examination of patient, obtaining history from patient or surrogate, ordering and performing treatments and interventions, ordering and review of laboratory studies, ordering and review of radiographic studies, pulse oximetry and re-evaluation of patient's condition.  Final Clinical Impressions(s) / ED Diagnoses   Final diagnoses:  Other acute pulmonary embolism without acute cor pulmonale [facility])    ED Discharge Orders    None       Donnetta Hutching, MD 03/19/17 1411    Donnetta Hutching, MD 03/19/17 [phone]

## 2017-03-19 NOTE — Progress Notes (Signed)
MD on call paged to see if pt could have anything other then Tylenol for pain. Waiting for orders/call back

## 2017-03-19 NOTE — ED Notes (Signed)
Call from lab  Critical trop 0.03  Dr Adriana Simasook will be informed

## 2017-03-20 ENCOUNTER — Observation Stay (HOSPITAL_COMMUNITY): Payer: Medicare HMO

## 2017-03-20 ENCOUNTER — Observation Stay (HOSPITAL_BASED_OUTPATIENT_CLINIC_OR_DEPARTMENT_OTHER): Payer: Medicare HMO

## 2017-03-20 DIAGNOSIS — I10 Essential (primary) hypertension: Secondary | ICD-10-CM

## 2017-03-20 DIAGNOSIS — I2699 Other pulmonary embolism without acute cor pulmonale: Secondary | ICD-10-CM | POA: Diagnosis not present

## 2017-03-20 DIAGNOSIS — I361 Nonrheumatic tricuspid (valve) insufficiency: Secondary | ICD-10-CM

## 2017-03-20 DIAGNOSIS — I82402 Acute embolism and thrombosis of unspecified deep veins of left lower extremity: Secondary | ICD-10-CM | POA: Diagnosis present

## 2017-03-20 DIAGNOSIS — R0781 Pleurodynia: Secondary | ICD-10-CM

## 2017-03-20 DIAGNOSIS — F17200 Nicotine dependence, unspecified, uncomplicated: Secondary | ICD-10-CM | POA: Diagnosis not present

## 2017-03-20 DIAGNOSIS — E785 Hyperlipidemia, unspecified: Secondary | ICD-10-CM

## 2017-03-20 DIAGNOSIS — I82432 Acute embolism and thrombosis of left popliteal vein: Secondary | ICD-10-CM | POA: Diagnosis not present

## 2017-03-20 LAB — ECHOCARDIOGRAM COMPLETE
AVLVOTPG: 8 mmHg
CHL CUP RV SYS PRESS: 59 mmHg
CHL CUP STROKE VOLUME: 57 mL
E/e' ratio: 9.75
EWDT: 303 ms
FS: 44 % (ref 28–44)
HEIGHTINCHES: 68 in
IVS/LV PW RATIO, ED: 0.87
LA ID, A-P, ES: 32 mm
LA vol index: 31.8 mL/m2
LA vol: 60.5 mL
LADIAMINDEX: 1.68 cm/m2
LAVOLA4C: 66.5 mL
LEFT ATRIUM END SYS DIAM: 32 mm
LV E/e' medial: 9.75
LV E/e'average: 9.75
LV SIMPSON'S DISK: 66
LV TDI E'MEDIAL: 8.16
LV dias vol index: 46 mL/m2
LV dias vol: 87 mL (ref 62–150)
LV e' LATERAL: 8.38 cm/s
LV sys vol index: 16 mL/m2
LVOT SV: 64 mL
LVOT VTI: 22.7 cm
LVOT area: 2.84 cm2
LVOT diameter: 19 mm
LVOT peak vel: 141 cm/s
LVSYSVOL: 30 mL (ref 21–61)
MV Dec: 303
MV Peak grad: 3 mmHg
MVPKAVEL: 93.2 m/s
MVPKEVEL: 81.7 m/s
PW: 14 mm — AB (ref 0.6–1.1)
RV LATERAL S' VELOCITY: 14.7 cm/s
Reg peak vel: 373 cm/s
TAPSE: 20.4 mm
TDI e' lateral: 8.38
TRMAXVEL: 373 cm/s
WEIGHTICAEL: 2640 [oz_av]

## 2017-03-20 LAB — CBC
HCT: 39.3 % (ref 39.0–52.0)
HEMOGLOBIN: 12.5 g/dL — AB (ref 13.0–17.0)
MCH: 26.8 pg (ref 26.0–34.0)
MCHC: 31.8 g/dL (ref 30.0–36.0)
MCV: 84.3 fL (ref 78.0–100.0)
PLATELETS: 188 10*3/uL (ref 150–400)
RBC: 4.66 MIL/uL (ref 4.22–5.81)
RDW: 13.6 % (ref 11.5–15.5)
WBC: 11.1 10*3/uL — ABNORMAL HIGH (ref 4.0–10.5)

## 2017-03-20 LAB — TROPONIN I

## 2017-03-20 LAB — HEPARIN LEVEL (UNFRACTIONATED): HEPARIN UNFRACTIONATED: 0.4 [IU]/mL (ref 0.30–0.70)

## 2017-03-20 MED ORDER — PNEUMOCOCCAL VAC POLYVALENT 25 MCG/0.5ML IJ INJ
0.5000 mL | INJECTION | INTRAMUSCULAR | Status: AC
  Administered 2017-03-21: 0.5 mL via INTRAMUSCULAR
  Filled 2017-03-20: qty 0.5

## 2017-03-20 NOTE — Progress Notes (Signed)
ANTICOAGULATION CONSULT NOTE - Initial Consult  Pharmacy Consult for HEPARIN Indication: pulmonary embolus  Allergies  Allergen Reactions  . Lipitor [Atorvastatin Calcium]     Patient Measurements: Height: 5\' 8"  (172.7 cm) Weight: 165 lb (74.8 kg) IBW/kg (Calculated) : 68.4 Heparin Dosing Weight: 73.5Kg  Vital Signs: Temp: 98.2 F (36.8 C) (12/31 0523) Temp Source: Oral (12/31 0523) BP: 132/72 (12/31 0523) Pulse Rate: 68 (12/31 0523)  Labs: Recent Labs    03/19/17 1037 03/19/17 2001 03/20/17 0247 03/20/17 0515  HGB 13.0  --   --  12.5*  HCT 40.1  --   --  39.3  PLT 193  --   --  188  APTT 37*  --   --   --   LABPROT 13.9  --   --   --   INR 1.08  --   --   --   HEPARINUNFRC  --  0.51  --  0.40  CREATININE 1.32*  --   --   --   TROPONINI 0.03* 0.03* <0.03  --     Estimated Creatinine Clearance: 53.3 mL/min (A) (by C-G formula based on SCr of 1.32 mg/dL (H)).   Medical History: Past Medical History:  Diagnosis Date  . Chest pain, unspecified    non-obstructive CAD (cath 06 and 2/04/24/08, normal LV fxn. 50% proximal LAD, luminal irregularities with 40% proximal circumflex, 25% proximal RCA, 40% ramus.  . History of benign prostatic hypertrophy   . Hypertension, benign   . Mixed hyperlipidemia   . Tobacco abuse     Medications:  Medications Prior to Admission  Medication Sig Dispense Refill Last Dose  . amLODipine (NORVASC) 10 MG tablet TAKE ONE TABLET BY MOUTH EVERY DAY 30 tablet 12 03/19/2017 at Unknown time  . aspirin 81 MG tablet Take 81 mg by mouth daily.     03/19/2017 at Unknown time  . atorvastatin (LIPITOR) 40 MG tablet Take 40 mg by mouth daily.   03/19/2017 at Unknown time  . benazepril (LOTENSIN) 20 MG tablet Take 20 mg by mouth daily.   03/19/2017 at Unknown time  . carvedilol (COREG) 6.25 MG tablet TAKE ONE TABLET BY MOUTH TWICE DAILY 60 tablet 3 03/19/2017 at 0900  . Phenyleph-Doxylamine-DM-APAP (ALKA-SELTZER PLS ALLERGY & [facility] PO) Take 1 Dose  by mouth daily as needed (cough).   03/19/2017 at Unknown time  . CRESTOR 40 MG tablet TAKE ONE TABLET BY MOUTH EVERY DAY (Patient not taking: Reported on 03/19/2017) 30 each 4 Not Taking    Assessment: 66yo male c/o left side CP. CT angio of the chest is positive for for central left pulmonary artery pulmonary thromboembolism.  Asked to initiate Heparin for PE.  HL is therapeutic.  Goal of Therapy:  Heparin level 0.3-0.7 units/ml Monitor platelets by anticoagulation protocol: Yes   Plan:   Continue Heparin infusion at 1200 units/hr  Heparin level daily  CBC daily while on Heparin  F/U plans for oral treatment  Elder CyphersLorie Daray Polgar, BS Loura BackPharm D, BCPS Clinical Pharmacist Pager #[phone] 03/20/2017,9:03 AM

## 2017-03-20 NOTE — Care Management (Signed)
Anticipated DC home on Eliquis per attending. Discussed with patient. Eliquis coupon voucher given.

## 2017-03-20 NOTE — Progress Notes (Signed)
*  PRELIMINARY RESULTS* Echocardiogram 2D Echocardiogram has been performed.  Stacey DrainWhite, Caeli Linehan J 03/20/2017, 3:52 PM

## 2017-03-20 NOTE — Progress Notes (Signed)
PROGRESS NOTE    Kevin Burnett  ZOX:[id number] DOB: 27-Feb-1951 DOA: 03/19/2017 PCP: Copper, Landis Gandy, PA-C   Brief Narrative:  66 year old male with a history of coronary artery disease, hypertension, tobacco use, presents to the hospital with complaints of shortness of breath and left-sided chest pain.  Had an elevated d-dimer in the emergency room and CT Angio of the chest confirmed left pulmonary artery pulmonary embolus.  There is also mention of developing left lower lobe pulmonary infarct.  Started on IV heparin and admitted to for further treatments.  Further workup confirms left leg DVT.   Assessment & Plan:   Active Problems:   Hyperlipidemia   TOBACCO ABUSE   HYPERTENSION, BENIGN   Coronary atherosclerosis   CHEST PAIN-UNSPECIFIED   Pulmonary embolism (HCC)   Left leg DVT (HCC)   1. Acute pulmonary embolus.  CT chest positive for central left pulmonary artery pulmonary embolism.  Started on intravenous heparin.  This appears to be an unprovoked event.  No prior history of thromboembolism, although he does report positive family history.  Will check hypercoagulable panel.  This will likely need to be repeated in the next few months.  Echocardiogram shows normal right ventricular systolic function.  We will plan on transitioning to Eliquis.  CT also indicates developing pulmonary infarct in the superior segment of the left lower lobe.  Continue current treatments  2. Left leg DVT.  Noted on Venous Dopplers.  Anticoagulation as above. 3. Hypertension.  Stable.  Continue current treatments. 4. Tobacco abuse.  Continue nicotine patch. 5. Hyperlipidemia.  Continue statin 6. Coronary artery disease.  Complaint of left-sided chest pain on admission, related to pulmonary embolus.  Continue on home regimen.   DVT prophylaxis: Heparin infusion Code Status: Full code Family Communication: Discussed with wife at the bedside Disposition Plan: Discharge home once  improved   Consultants:     Procedures:  Echo:- Left ventricle: The cavity size was normal. Wall thickness was   increased in a pattern of moderate LVH. Systolic function was   normal. The estimated ejection fraction was in the range of 60%   to 65%. Wall motion was normal; there were no regional wall   motion abnormalities. Doppler parameters are consistent with   abnormal left ventricular relaxation (grade 1 diastolic   dysfunction). - Aortic valve: Valve area (VTI): 2.41 cm^2. Valve area (Vmax):   2.44 cm^2. Valve area (Vmean): 2.72 cm^2. - Left atrium: The atrium was moderately dilated. - Atrial septum: No defect or patent foramen ovale was identified. - Pulmonary arteries: Systolic pressure was moderately increased.   PA peak pressure: 59 mm Hg (S).  - Technically adequate study.  Antimicrobials:       Subjective: Feeling better, although breathing is not back to baseline.  Still becomes short of breath on exertion  Objective: Vitals:   03/19/17 1559 03/19/17 2155 03/20/17 0523 03/20/17 1250  BP: (!) 168/79 114/68 132/72 139/77  Pulse: 68 68 68 75  Resp: (!) 21 20 16 16   Temp: 99.8 F (37.7 C) 98.9 F (37.2 C) 98.2 F (36.8 C) 100.2 F (37.9 C)  TempSrc: Oral Oral Oral Oral  SpO2: 96% 92% 96% 97%  Weight:      Height:        Intake/Output Summary (Last 24 hours) at 03/20/2017 1741 Last data filed at 03/20/2017 1200 Gross per 24 hour  Intake 1112.6 ml  Output -  Net 1112.6 ml   Filed Weights   03/19/17 1032 03/19/17 1556  Weight: 73.5 kg (162 lb) 74.8 kg (165 lb)    Examination:  General exam: Appears calm and comfortable  Respiratory system: Clear to auscultation. Respiratory effort normal. Cardiovascular system: S1 & S2 heard, RRR. No JVD, murmurs, rubs, gallops or clicks. No pedal edema. Gastrointestinal system: Abdomen is nondistended, soft and nontender. No organomegaly or masses felt. Normal bowel sounds heard. Central nervous system: Alert  and oriented. No focal neurological deficits. Extremities: Symmetric 5 x 5 power. Skin: No rashes, lesions or ulcers Psychiatry: Judgement and insight appear normal. Mood & affect appropriate.     Data Reviewed: I have personally reviewed following labs and imaging studies  CBC: Recent Labs  Lab 03/19/17 1037 03/20/17 0515  WBC 10.1 11.1*  HGB 13.0 12.5*  HCT 40.1 39.3  MCV 84.1 84.3  PLT 193 188   Basic Metabolic Panel: Recent Labs  Lab 03/19/17 1037  NA 132*  K 3.7  CL 101  CO2 20*  GLUCOSE 127*  BUN 14  CREATININE 1.32*  CALCIUM 8.8*   GFR: Estimated Creatinine Clearance: 53.3 mL/min (A) (by C-G formula based on SCr of 1.32 mg/dL (H)). Liver Function Tests: No results for input(s): AST, ALT, ALKPHOS, BILITOT, PROT, ALBUMIN in the last 168 hours. No results for input(s): LIPASE, AMYLASE in the last 168 hours. No results for input(s): AMMONIA in the last 168 hours. Coagulation Profile: Recent Labs  Lab 03/19/17 1037  INR 1.08   Cardiac Enzymes: Recent Labs  Lab 03/19/17 1037 03/19/17 2001 03/20/17 0247 03/20/17 0858  TROPONINI 0.03* 0.03* <0.03 <0.03   BNP (last 3 results) No results for input(s): PROBNP in the last 8760 hours. HbA1C: No results for input(s): HGBA1C in the last 72 hours. CBG: No results for input(s): GLUCAP in the last 168 hours. Lipid Profile: No results for input(s): CHOL, HDL, LDLCALC, TRIG, CHOLHDL, LDLDIRECT in the last 72 hours. Thyroid Function Tests: No results for input(s): TSH, T4TOTAL, FREET4, T3FREE, THYROIDAB in the last 72 hours. Anemia Panel: No results for input(s): VITAMINB12, FOLATE, FERRITIN, TIBC, IRON, RETICCTPCT in the last 72 hours. Sepsis Labs: No results for input(s): PROCALCITON, LATICACIDVEN in the last 168 hours.  No results found for this or any previous visit (from the past 240 hour(s)).       Radiology Studies: Dg Chest 2 View  Result Date: 03/19/2017 CLINICAL DATA:  Chest pain EXAM: CHEST   2 VIEW COMPARISON:  02/12/2015 FINDINGS: The heart size and mediastinal contours are within normal limits. Both lungs are clear. The visualized skeletal structures are unremarkable. IMPRESSION: No active cardiopulmonary disease. Electronically Signed   By: Signa Kellaylor  Stroud M.D.   On: 03/19/2017 11:24   Ct Angio Chest Pe W And/or Wo Contrast  Result Date: 03/19/2017 CLINICAL DATA:  Chest pain EXAM: CT ANGIOGRAPHY CHEST WITH CONTRAST TECHNIQUE: Multidetector CT imaging of the chest was performed using the standard protocol during bolus administration of intravenous contrast. Multiplanar CT image reconstructions and MIPs were obtained to evaluate the vascular anatomy. CONTRAST:  80mL ISOVUE-370 IOPAMIDOL (ISOVUE-370) INJECTION 76% COMPARISON:  02/12/2015 FINDINGS: Cardiovascular: There is serpiginous low-density filling defect in the left main pulmonary artery extending into left upper and lower lobe lobar and segmental branches. There are no filling defects in the right pulmonary arterial tree. There is no evidence of right heart strain. Moderate 3 vessel coronary artery calcification. No evidence of aortic dissection. Atherosclerotic calcifications of the aortic arch. Mediastinum/Nodes: No abnormal mediastinal adenopathy. No pericardial effusion. Lungs/Pleura: No pneumothorax. Tiny left pleural effusion. Dependent atelectasis  bilaterally. Heterogeneous opacities are present in the superior segment of the left lower lobe with a peripheral wedge-shaped appearance worrisome for developing infarct. See image 70 of series 6. Upper Abdomen: Nonspecific calcification in the left adrenal gland. Simple cyst in the left kidney. Musculoskeletal: No vertebral compression deformity. Review of the MIP images confirms the above findings. IMPRESSION: Study is positive for central left pulmonary artery pulmonary thromboembolism. Critical Value/emergent results were called by telephone at the time of interpretation on 03/19/2017 at  2:10 pm to Dr. Donnetta Hutching , who verbally acknowledged these results. Developing pulmonary infarct in the superior segment of the left lower lobe. Aortic Atherosclerosis (ICD10-I70.0). Electronically Signed   By: Jolaine Click M.D.   On: 03/19/2017 14:11   US Venous Img Lower Bilateral  Result Date: 03/20/2017 CLINICAL DATA:  History of pulmonary embolism.  Evaluate for DVT. EXAM: BILATERAL LOWER EXTREMITY VENOUS DOPPLER ULTRASOUND TECHNIQUE: Gray-scale sonography with graded compression, as well as color Doppler and duplex ultrasound were performed to evaluate the lower extremity deep venous systems from the level of the common femoral vein and including the common femoral, femoral, profunda femoral, popliteal and calf veins including the posterior tibial, peroneal and gastrocnemius veins when visible. The superficial great saphenous vein was also interrogated. Spectral Doppler was utilized to evaluate flow at rest and with distal augmentation maneuvers in the common femoral, femoral and popliteal veins. COMPARISON:  Chest CTA - 03/19/2017 FINDINGS: RIGHT LOWER EXTREMITY Common Femoral Vein: No evidence of thrombus. Normal compressibility, respiratory phasicity and response to augmentation. Saphenofemoral Junction: No evidence of thrombus. Normal compressibility and flow on color Doppler imaging. Profunda Femoral Vein: No evidence of thrombus. Normal compressibility and flow on color Doppler imaging. Femoral Vein: No evidence of thrombus. Normal compressibility, respiratory phasicity and response to augmentation. Popliteal Vein: No evidence of thrombus. Normal compressibility, respiratory phasicity and response to augmentation. Calf Veins: No evidence of thrombus. Normal compressibility and flow on color Doppler imaging. Superficial Great Saphenous Vein: No evidence of thrombus. Normal compressibility. Other Findings:  None. LEFT LOWER EXTREMITY Common Femoral Vein: No evidence of thrombus. Normal  compressibility, respiratory phasicity and response to augmentation. Saphenofemoral Junction: No evidence of thrombus. Normal compressibility and flow on color Doppler imaging. Profunda Femoral Vein: No evidence of thrombus. Normal compressibility and flow on color Doppler imaging. Femoral vein: There is hypoechoic nonocclusive DVT within the proximal and distal aspects of the left femoral vein (images 42, 51 and 65). There is hypoechoic slightly expansile occlusive thrombus involving the mid aspect of the left femoral vein (image 48). Popliteal Vein: There is hypoechoic expansile near occlusive DVT involving the left popliteal vein (images 55). Calf Veins: No evidence of thrombus. Normal compressibility and flow on color Doppler imaging. Superficial Great Saphenous Vein: No evidence of thrombus. Normal compressibility. Other Findings:  None. IMPRESSION: 1. Examination is positive for mixed occlusive though predominantly nonocclusive DVT involving the left femoral and popliteal veins. 2. No evidence DVT within the right lower extremity. Electronically Signed   By: Simonne Come M.D.   On: 03/20/2017 13:22        Scheduled Meds: . amLODipine  10 mg Oral Daily  . atorvastatin  40 mg Oral Daily  . benazepril  20 mg Oral Daily  . carvedilol  6.25 mg Oral BID  . [START ON 03/21/2017] pneumococcal 23 valent vaccine  0.5 mL Intramuscular Tomorrow-1000   Continuous Infusions: . heparin 1,200 Units/hr (03/20/17 0523)     LOS: 0 days    Time spent:  30mins    Erick BlinksJehanzeb Jermar Colter, MD [facility] Pager [phone]  If 7PM-7AM, please contact night-coverage [email or web address] Password [email or web address] 03/20/2017, 5:41 PM

## 2017-03-20 NOTE — Care Management Obs Status (Signed)
MEDICARE OBSERVATION STATUS NOTIFICATION   Patient Details  Name: Kevin NewcomerBobby L Akram MRN: [id number] Date of Birth: [birth year]   Medicare Observation Status Notification Given:  Yes    Benard Minturn, Chrystine OilerSharley Diane, RN 03/20/2017, 11:30 AM

## 2017-03-21 DIAGNOSIS — R0781 Pleurodynia: Secondary | ICD-10-CM | POA: Diagnosis not present

## 2017-03-21 DIAGNOSIS — I10 Essential (primary) hypertension: Secondary | ICD-10-CM | POA: Diagnosis not present

## 2017-03-21 DIAGNOSIS — E785 Hyperlipidemia, unspecified: Secondary | ICD-10-CM | POA: Diagnosis not present

## 2017-03-21 DIAGNOSIS — I2699 Other pulmonary embolism without acute cor pulmonale: Secondary | ICD-10-CM | POA: Diagnosis not present

## 2017-03-21 LAB — CBC
HEMATOCRIT: 38 % — AB (ref 39.0–52.0)
HEMOGLOBIN: 12.4 g/dL — AB (ref 13.0–17.0)
MCH: 27.5 pg (ref 26.0–34.0)
MCHC: 32.6 g/dL (ref 30.0–36.0)
MCV: 84.3 fL (ref 78.0–100.0)
Platelets: 226 10*3/uL (ref 150–400)
RBC: 4.51 MIL/uL (ref 4.22–5.81)
RDW: 13.5 % (ref 11.5–15.5)
WBC: 10.9 10*3/uL — ABNORMAL HIGH (ref 4.0–10.5)

## 2017-03-21 LAB — HEPARIN LEVEL (UNFRACTIONATED): HEPARIN UNFRACTIONATED: 0.38 [IU]/mL (ref 0.30–0.70)

## 2017-03-21 LAB — ANTITHROMBIN III: ANTITHROMB III FUNC: 99 % (ref 75–120)

## 2017-03-21 MED ORDER — APIXABAN 5 MG PO TABS
10.0000 mg | ORAL_TABLET | Freq: Two times a day (BID) | ORAL | Status: DC
  Administered 2017-03-21: 10 mg via ORAL
  Filled 2017-03-21: qty 2

## 2017-03-21 MED ORDER — ELIQUIS 5 MG VTE STARTER PACK: ORAL_TABLET | ORAL | 0 refills | Status: AC

## 2017-03-21 NOTE — Progress Notes (Signed)
AVS discussed.    IV removed.  Site clean, dry and intact.  Pt taken down by tech.  Pt stable upon  Discharge.

## 2017-03-21 NOTE — Progress Notes (Signed)
ANTICOAGULATION CONSULT NOTE - Initial Consult  Pharmacy Consult for HEPARIN Indication: pulmonary embolus  Allergies  Allergen Reactions  . Lipitor [Atorvastatin Calcium]     Patient Measurements: Height: 5\' 8"  (172.7 cm) Weight: 165 lb (74.8 kg) IBW/kg (Calculated) : 68.4 Heparin Dosing Weight: 73.5Kg  Vital Signs: Temp: 98.7 F (37.1 C) (01/01 0510) Temp Source: Oral (01/01 0510) BP: 117/70 (01/01 0510) Pulse Rate: 65 (01/01 0510)  Labs: Recent Labs    03/19/17 1037 03/19/17 2001 03/20/17 0247 03/20/17 0515 03/20/17 0858 03/21/17 0430  HGB 13.0  --   --  12.5*  --  12.4*  HCT 40.1  --   --  39.3  --  38.0*  PLT 193  --   --  188  --  226  APTT 37*  --   --   --   --   --   LABPROT 13.9  --   --   --   --   --   INR 1.08  --   --   --   --   --   HEPARINUNFRC  --  0.51  --  0.40  --  0.38  CREATININE 1.32*  --   --   --   --   --   TROPONINI 0.03* 0.03* <0.03 TEST REQUEST RECEIVED WITHOUT APPROPRIATE SPECIMEN <0.03  --     Estimated Creatinine Clearance: 53.3 mL/min (A) (by C-G formula based on SCr of 1.32 mg/dL (H)).   Medical History: Past Medical History:  Diagnosis Date  . Chest pain, unspecified    non-obstructive CAD (cath 06 and 2/04/24/08, normal LV fxn. 50% proximal LAD, luminal irregularities with 40% proximal circumflex, 25% proximal RCA, 40% ramus.  . History of benign prostatic hypertrophy   . Hypertension, benign   . Mixed hyperlipidemia   . Tobacco abuse     Medications:  Medications Prior to Admission  Medication Sig Dispense Refill Last Dose  . amLODipine (NORVASC) 10 MG tablet TAKE ONE TABLET BY MOUTH EVERY DAY 30 tablet 12 03/19/2017 at Unknown time  . aspirin 81 MG tablet Take 81 mg by mouth daily.     03/19/2017 at Unknown time  . atorvastatin (LIPITOR) 40 MG tablet Take 40 mg by mouth daily.   03/19/2017 at Unknown time  . benazepril (LOTENSIN) 20 MG tablet Take 20 mg by mouth daily.   03/19/2017 at Unknown time  . carvedilol  (COREG) 6.25 MG tablet TAKE ONE TABLET BY MOUTH TWICE DAILY 60 tablet 3 03/19/2017 at 0900  . Phenyleph-Doxylamine-DM-APAP (ALKA-SELTZER PLS ALLERGY & [facility] PO) Take 1 Dose by mouth daily as needed (cough).   03/19/2017 at Unknown time  . CRESTOR 40 MG tablet TAKE ONE TABLET BY MOUTH EVERY DAY (Patient not taking: Reported on 03/19/2017) 30 each 4 Not Taking    Assessment: 67yo male c/o left side CP. CT angio of the chest is positive for for central left pulmonary artery pulmonary thromboembolism.  Asked to initiate Heparin for PE.  HL remains therapeutic.  Goal of Therapy:  Heparin level 0.3-0.7 units/ml Monitor platelets by anticoagulation protocol: Yes   Plan:   Continue Heparin infusion at 1200 units/hr  Heparin level daily  CBC daily while on Heparin  F/U plans for oral treatment  Elder CyphersLorie Welton Bord, BS Loura BackPharm D, BCPS Clinical Pharmacist Pager #[phone] 03/21/2017,8:27 AM

## 2017-03-21 NOTE — Discharge Summary (Signed)
Physician Discharge Summary  Kevin Burnett ZOX:[id number] DOB: 1950-05-12 DOA: 03/19/2017  PCP: Copper, Landis Gandy, PA-C  Admit date: 03/19/2017 Discharge date: 03/21/2017  Admitted From: Home Disposition: Home  Recommendations for Outpatient Follow-up:  1. Follow up with PCP in 1-2 weeks 2. Please obtain BMP/CBC in one week 3. Results for hypercoagulable panel pending at the time of discharge.  Consider repeating hypercoagulable panel in the next few months, since he will likely have false positive/false negative in the setting of an acute clot  Discharge Cond full code Diet recommendation: Heart Healthy   Brief/Interim Summary: 67 year old male with a history of coronary artery disease, hypertension, tobacco use, presented to the hospital with complaints of shortness of breath and left-sided chest pain.  He was found to have an elevated d-dimer in the emergency room and CT Angie of the chest confirmed left pulmonary artery pulmonary embolus.  There was also mention of developing left lower lobe pulmonary infarct.  Patient was admitted to the hospital and started on intravenous heparin.  Further workup including venous Dopplers confirmed left leg DVT.  Echocardiogram showed that the RV systolic function was normal.  Ejection fraction was also noted to be normal range.  Patient does not have any prior history of thromboembolism, but does report a family history with his brother and sister having blood clots in the past.  Hypercoagulable panel has been sent and is currently in process at the time of discharge.  This can be followed up as an outpatient.  Patient is feeling significantly better after starting on anticoagulation.  Shortness of breath and chest pain have improved.  He is been transitioned to Eliquis.  He feels stable for discharge home.  Discharge Diagnoses:  Active Problems:   Hyperlipidemia   TOBACCO ABUSE   HYPERTENSION, BENIGN   Coronary atherosclerosis   CHEST PAIN-UNSPECIFIED    Pulmonary embolism (HCC)   Left leg DVT [facility])    Discharge Instructions  Discharge Instructions    Diet - low sodium heart healthy   Complete by:  As directed    Increase activity slowly   Complete by:  As directed      Allergies as of 03/21/2017      Reactions   Lipitor [atorvastatin Calcium]       Medication List    TAKE these medications   ALKA-SELTZER PLS ALLERGY & CGH PO Take 1 Dose by mouth daily as needed (cough).   amLODipine 10 MG tablet Commonly known as:  NORVASC TAKE ONE TABLET BY MOUTH EVERY DAY   aspirin 81 MG tablet Take 81 mg by mouth daily.   atorvastatin 40 MG tablet Commonly known as:  LIPITOR Take 40 mg by mouth daily.   benazepril 20 MG tablet Commonly known as:  LOTENSIN Take 20 mg by mouth daily.   carvedilol 6.25 MG tablet Commonly known as:  COREG TAKE ONE TABLET BY MOUTH TWICE DAILY   CRESTOR 40 MG tablet Generic drug:  rosuvastatin TAKE ONE TABLET BY MOUTH EVERY DAY   ELIQUIS STARTER PACK 5 MG Tabs Take as directed on package: start with two-5mg  tablets twice daily for 7 days. On day 8, switch to one-5mg  tablet twice daily.       Allergies  Allergen Reactions  . Lipitor [Atorvastatin Calcium]     Consultations:     Procedures/Studies: Dg Chest 2 View  Result Date: 03/19/2017 CLINICAL DATA:  Chest pain EXAM: CHEST  2 VIEW COMPARISON:  02/12/2015 FINDINGS: The heart size and mediastinal contours are  within normal limits. Both lungs are clear. The visualized skeletal structures are unremarkable. IMPRESSION: No active cardiopulmonary disease. Electronically Signed   By: Signa Kellaylor  Stroud M.D.   On: 03/19/2017 11:24   Ct Angio Chest Pe W And/or Wo Contrast  Result Date: 03/19/2017 CLINICAL DATA:  Chest pain EXAM: CT ANGIOGRAPHY CHEST WITH CONTRAST TECHNIQUE: Multidetector CT imaging of the chest was performed using the standard protocol during bolus administration of intravenous contrast. Multiplanar CT image reconstructions  and MIPs were obtained to evaluate the vascular anatomy. CONTRAST:  80mL ISOVUE-370 IOPAMIDOL (ISOVUE-370) INJECTION 76% COMPARISON:  02/12/2015 FINDINGS: Cardiovascular: There is serpiginous low-density filling defect in the left main pulmonary artery extending into left upper and lower lobe lobar and segmental branches. There are no filling defects in the right pulmonary arterial tree. There is no evidence of right heart strain. Moderate 3 vessel coronary artery calcification. No evidence of aortic dissection. Atherosclerotic calcifications of the aortic arch. Mediastinum/Nodes: No abnormal mediastinal adenopathy. No pericardial effusion. Lungs/Pleura: No pneumothorax. Tiny left pleural effusion. Dependent atelectasis bilaterally. Heterogeneous opacities are present in the superior segment of the left lower lobe with a peripheral wedge-shaped appearance worrisome for developing infarct. See image 70 of series 6. Upper Abdomen: Nonspecific calcification in the left adrenal gland. Simple cyst in the left kidney. Musculoskeletal: No vertebral compression deformity. Review of the MIP images confirms the above findings. IMPRESSION: Study is positive for central left pulmonary artery pulmonary thromboembolism. Critical Value/emergent results were called by telephone at the time of interpretation on 03/19/2017 at 2:10 pm to Dr. Donnetta HutchingBRIAN COOK , who verbally acknowledged these results. Developing pulmonary infarct in the superior segment of the left lower lobe. Aortic Atherosclerosis (ICD10-I70.0). Electronically Signed   By: Jolaine ClickArthur  Hoss M.D.   On: 03/19/2017 14:11   [place] Venous Img Lower Bilateral  Result Date: 03/20/2017 CLINICAL DATA:  History of pulmonary embolism.  Evaluate for DVT. EXAM: BILATERAL LOWER EXTREMITY VENOUS DOPPLER ULTRASOUND TECHNIQUE: Gray-scale sonography with graded compression, as well as color Doppler and duplex ultrasound were performed to evaluate the lower extremity deep venous systems from the  level of the common femoral vein and including the common femoral, femoral, profunda femoral, popliteal and calf veins including the posterior tibial, peroneal and gastrocnemius veins when visible. The superficial great saphenous vein was also interrogated. Spectral Doppler was utilized to evaluate flow at rest and with distal augmentation maneuvers in the common femoral, femoral and popliteal veins. COMPARISON:  Chest CTA - 03/19/2017 FINDINGS: RIGHT LOWER EXTREMITY Common Femoral Vein: No evidence of thrombus. Normal compressibility, respiratory phasicity and response to augmentation. Saphenofemoral Junction: No evidence of thrombus. Normal compressibility and flow on color Doppler imaging. Profunda Femoral Vein: No evidence of thrombus. Normal compressibility and flow on color Doppler imaging. Femoral Vein: No evidence of thrombus. Normal compressibility, respiratory phasicity and response to augmentation. Popliteal Vein: No evidence of thrombus. Normal compressibility, respiratory phasicity and response to augmentation. Calf Veins: No evidence of thrombus. Normal compressibility and flow on color Doppler imaging. Superficial Great Saphenous Vein: No evidence of thrombus. Normal compressibility. Other Findings:  None. LEFT LOWER EXTREMITY Common Femoral Vein: No evidence of thrombus. Normal compressibility, respiratory phasicity and response to augmentation. Saphenofemoral Junction: No evidence of thrombus. Normal compressibility and flow on color Doppler imaging. Profunda Femoral Vein: No evidence of thrombus. Normal compressibility and flow on color Doppler imaging. Femoral vein: There is hypoechoic nonocclusive DVT within the proximal and distal aspects of the left femoral vein (images 42, 51 and 65). There is  hypoechoic slightly expansile occlusive thrombus involving the mid aspect of the left femoral vein (image 48). Popliteal Vein: There is hypoechoic expansile near occlusive DVT involving the left  popliteal vein (images 55). Calf Veins: No evidence of thrombus. Normal compressibility and flow on color Doppler imaging. Superficial Great Saphenous Vein: No evidence of thrombus. Normal compressibility. Other Findings:  None. IMPRESSION: 1. Examination is positive for mixed occlusive though predominantly nonocclusive DVT involving the left femoral and popliteal veins. 2. No evidence DVT within the right lower extremity. Electronically Signed   By: Simonne Come M.D.   On: 03/20/2017 13:22     Echo:- Left ventricle: The cavity size was normal. Wall thickness was   increased in a pattern of moderate LVH. Systolic function was   normal. The estimated ejection fraction was in the range of 60%   to 65%. Wall motion was normal; there were no regional wall   motion abnormalities. Doppler parameters are consistent with   abnormal left ventricular relaxation (grade 1 diastolic   dysfunction). - Aortic valve: Valve area (VTI): 2.41 cm^2. Valve area (Vmax):   2.44 cm^2. Valve area (Vmean): 2.72 cm^2. - Left atrium: The atrium was moderately dilated. - Atrial septum: No defect or patent foramen ovale was identified. - Pulmonary arteries: Systolic pressure was moderately increased.   PA peak pressure: 59 mm Hg (S).  Subjective: Feeling better today.  Shortness of breath is better.  Chest pain is better.  Discharge Exam: Vitals:   03/21/17 0510 03/21/17 1408  BP: 117/70 127/72  Pulse: 65 76  Resp: 17 18  Temp: 98.7 F (37.1 C)   SpO2: 94% 96%   Vitals:   03/20/17 2033 03/20/17 2111 03/21/17 0510 03/21/17 1408  BP: 117/70  117/70 127/72  Pulse: 73  65 76  Resp: 16  17 18   Temp: 98 F (36.7 C)  98.7 F (37.1 C)   TempSrc: Oral  Oral   SpO2: 94% 95% 94% 96%  Weight:      Height:        General: Pt is alert, awake, not in acute distress Cardiovascular: RRR, S1/S2 +, no rubs, no gallops Respiratory: CTA bilaterally, no wheezing, no rhonchi Abdominal: Soft, NT, ND, bowel sounds  + Extremities: no edema, no cyanosis    The results of significant diagnostics from this hospitalization (including imaging, microbiology, ancillary and laboratory) are listed below for reference.     Microbiology: No results found for this or any previous visit (from the past 240 hour(s)).   Labs: BNP (last 3 results) No results for input(s): BNP in the last 8760 hours. Basic Metabolic Panel: Recent Labs  Lab 03/19/17 1037  NA 132*  K 3.7  CL 101  CO2 20*  GLUCOSE 127*  BUN 14  CREATININE 1.32*  CALCIUM 8.8*   Liver Function Tests: No results for input(s): AST, ALT, ALKPHOS, BILITOT, PROT, ALBUMIN in the last 168 hours. No results for input(s): LIPASE, AMYLASE in the last 168 hours. No results for input(s): AMMONIA in the last 168 hours. CBC: Recent Labs  Lab 03/19/17 1037 03/20/17 0515 03/21/17 0430  WBC 10.1 11.1* 10.9*  HGB 13.0 12.5* 12.4*  HCT 40.1 39.3 38.0*  MCV 84.1 84.3 84.3  PLT 193 188 226   Cardiac Enzymes: Recent Labs  Lab 03/19/17 1037 03/19/17 2001 03/20/17 0247 03/20/17 0515 03/20/17 0858  TROPONINI 0.03* 0.03* <0.03 TEST REQUEST RECEIVED WITHOUT APPROPRIATE SPECIMEN <0.03   BNP: Invalid input(s): POCBNP CBG: No results for input(s): GLUCAP in  the last 168 hours. D-Dimer Recent Labs    03/19/17 1106  DDIMER 9.69*   Hgb A1c No results for input(s): HGBA1C in the last 72 hours. Lipid Profile No results for input(s): CHOL, HDL, LDLCALC, TRIG, CHOLHDL, LDLDIRECT in the last 72 hours. Thyroid function studies No results for input(s): TSH, T4TOTAL, T3FREE, THYROIDAB in the last 72 hours.  Invalid input(s): FREET3 Anemia work up No results for input(s): VITAMINB12, FOLATE, FERRITIN, TIBC, IRON, RETICCTPCT in the last 72 hours. Urinalysis No results found for: COLORURINE, APPEARANCEUR, LABSPEC, PHURINE, GLUCOSEU, HGBUR, BILIRUBINUR, KETONESUR, PROTEINUR, UROBILINOGEN, NITRITE, LEUKOCYTESUR Sepsis Labs Invalid input(s):  PROCALCITONIN,  WBC,  LACTICIDVEN Microbiology No results found for this or any previous visit (from the past 240 hour(s)).   Time coordinating discharge: Over 30 minutes  SIGNED:   Erick Blinks, MD  [facility] 03/21/2017, 2:39 PM Pager   If 7PM-7AM, please contact night-coverage [email or web address] Password [email or web address]

## 2017-03-22 LAB — PROTEIN C, TOTAL: Protein C, Total: 80 % (ref 60–150)

## 2017-03-22 LAB — HOMOCYSTEINE: HOMOCYSTEINE-NORM: 14.8 umol/L (ref 0.0–15.0)

## 2017-03-23 LAB — CARDIOLIPIN ANTIBODIES, IGG, IGM, IGA
ANTICARDIOLIPIN IGM: 18 [MPL'U]/mL — AB (ref 0–12)
Anticardiolipin IgA: <9 APL U/mL (ref 0–11)

## 2017-03-23 LAB — BETA-2-GLYCOPROTEIN I ABS, IGG/M/A: Beta-2-Glycoprotein I IgA: <9 GPI IgA units (ref 0–25)

## 2017-03-27 LAB — LUPUS ANTICOAGULANT PANEL
DRVVT: 60 s — ABNORMAL HIGH (ref 0.0–47.0)
PTT LA: 62.8 s — AB (ref 0.0–51.9)

## 2017-03-27 LAB — PROTHROMBIN GENE MUTATION

## 2017-03-27 LAB — PROTEIN S ACTIVITY: Protein S Activity: 84 % (ref 63–140)

## 2017-03-27 LAB — PTT-LA MIX: PTT-LA MIX: 56 s — AB (ref 0.0–48.9)

## 2017-03-27 LAB — PROTEIN C ACTIVITY: Protein C Activity: 82 % (ref 73–180)

## 2017-03-27 LAB — HEXAGONAL PHASE PHOSPHOLIPID: Hexagonal Phase Phospholipid: 14 s — ABNORMAL HIGH (ref 0–11)

## 2017-03-27 LAB — FACTOR 5 LEIDEN

## 2017-03-27 LAB — DRVVT MIX: dRVVT Mix: 46.7 s (ref 0.0–47.0)

## 2017-03-27 LAB — PROTEIN S, TOTAL: Protein S Ag, Total: 143 % (ref 60–150)

## 2017-06-19 DIAGNOSIS — Z7901 Long term (current) use of anticoagulants: Secondary | ICD-10-CM | POA: Diagnosis not present

## 2017-06-19 DIAGNOSIS — I2510 Atherosclerotic heart disease of native coronary artery without angina pectoris: Secondary | ICD-10-CM | POA: Diagnosis not present

## 2017-06-19 DIAGNOSIS — E7800 Pure hypercholesterolemia, unspecified: Secondary | ICD-10-CM | POA: Diagnosis not present

## 2017-06-19 DIAGNOSIS — Z87891 Personal history of nicotine dependence: Secondary | ICD-10-CM | POA: Diagnosis not present

## 2017-06-19 DIAGNOSIS — I252 Old myocardial infarction: Secondary | ICD-10-CM | POA: Diagnosis not present

## 2017-06-19 DIAGNOSIS — I10 Essential (primary) hypertension: Secondary | ICD-10-CM | POA: Diagnosis not present

## 2017-06-19 DIAGNOSIS — Z23 Encounter for immunization: Secondary | ICD-10-CM | POA: Diagnosis not present

## 2017-06-19 DIAGNOSIS — Z8249 Family history of ischemic heart disease and other diseases of the circulatory system: Secondary | ICD-10-CM | POA: Diagnosis not present

## 2017-10-20 DIAGNOSIS — I10 Essential (primary) hypertension: Secondary | ICD-10-CM | POA: Diagnosis not present

## 2017-10-20 DIAGNOSIS — E7800 Pure hypercholesterolemia, unspecified: Secondary | ICD-10-CM | POA: Diagnosis not present

## 2017-10-20 DIAGNOSIS — I252 Old myocardial infarction: Secondary | ICD-10-CM | POA: Diagnosis not present

## 2017-10-20 DIAGNOSIS — Z86718 Personal history of other venous thrombosis and embolism: Secondary | ICD-10-CM | POA: Diagnosis not present

## 2017-10-20 DIAGNOSIS — Z7901 Long term (current) use of anticoagulants: Secondary | ICD-10-CM | POA: Diagnosis not present

## 2017-10-20 DIAGNOSIS — Z79899 Other long term (current) drug therapy: Secondary | ICD-10-CM | POA: Diagnosis not present

## 2017-10-20 DIAGNOSIS — I2510 Atherosclerotic heart disease of native coronary artery without angina pectoris: Secondary | ICD-10-CM | POA: Diagnosis not present

## 2017-10-20 DIAGNOSIS — Z86711 Personal history of pulmonary embolism: Secondary | ICD-10-CM | POA: Diagnosis not present

## 2017-10-20 DIAGNOSIS — Z87891 Personal history of nicotine dependence: Secondary | ICD-10-CM | POA: Diagnosis not present

## 2017-10-20 DIAGNOSIS — R5383 Other fatigue: Secondary | ICD-10-CM | POA: Diagnosis not present

## 2018-04-18 DIAGNOSIS — Z23 Encounter for immunization: Secondary | ICD-10-CM | POA: Diagnosis not present

## 2018-04-18 DIAGNOSIS — F17290 Nicotine dependence, other tobacco product, uncomplicated: Secondary | ICD-10-CM | POA: Diagnosis not present

## 2018-04-18 DIAGNOSIS — I2510 Atherosclerotic heart disease of native coronary artery without angina pectoris: Secondary | ICD-10-CM | POA: Diagnosis not present

## 2018-04-18 DIAGNOSIS — Z86711 Personal history of pulmonary embolism: Secondary | ICD-10-CM | POA: Diagnosis not present

## 2018-04-18 DIAGNOSIS — Z86718 Personal history of other venous thrombosis and embolism: Secondary | ICD-10-CM | POA: Diagnosis not present

## 2018-04-18 DIAGNOSIS — I10 Essential (primary) hypertension: Secondary | ICD-10-CM | POA: Diagnosis not present

## 2018-04-18 DIAGNOSIS — I252 Old myocardial infarction: Secondary | ICD-10-CM | POA: Diagnosis not present

## 2018-04-18 DIAGNOSIS — E7800 Pure hypercholesterolemia, unspecified: Secondary | ICD-10-CM | POA: Diagnosis not present

## 2018-05-25 IMAGING — CT CT ANGIO CHEST
2 of 6 series · 18 of 46 positions shown · IV contrast (Isovue)
Comparison: 02/12/2015

CLINICAL DATA: Chest pain

EXAM:
CT ANGIOGRAPHY CHEST WITH CONTRAST
TECHNIQUE: Multidetector CT imaging of the chest was performed using the
standard protocol during bolus administration of intravenous
contrast. Multiplanar CT image reconstructions and MIPs were
obtained to evaluate the vascular anatomy.
CONTRAST:  80mL [id number]-N08 IOPAMIDOL ([id number]-N08) INJECTION 76%

[Series 5: thins · axial · 0.71mm/px · z∈[+1159,+1458]mm · 15 of 329 slices shown]
[im 15/329  lung]
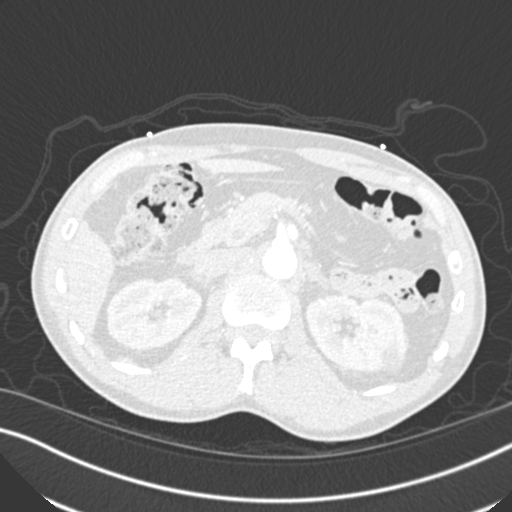
[im 43/329  soft-tissue]
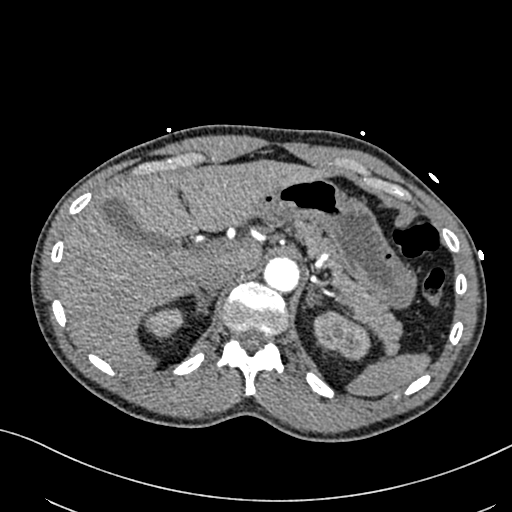
[im 58/329  lung]
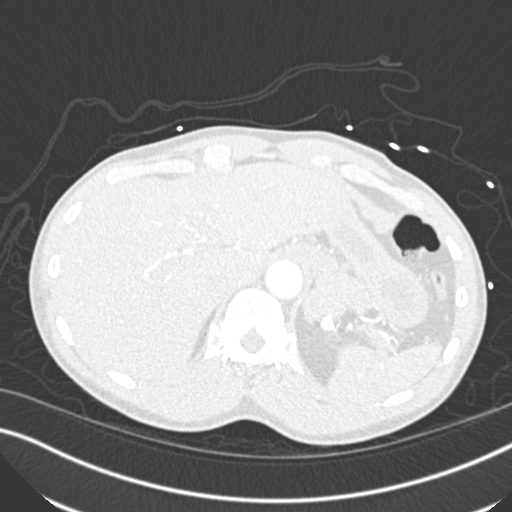
[im 86/329  soft-tissue]
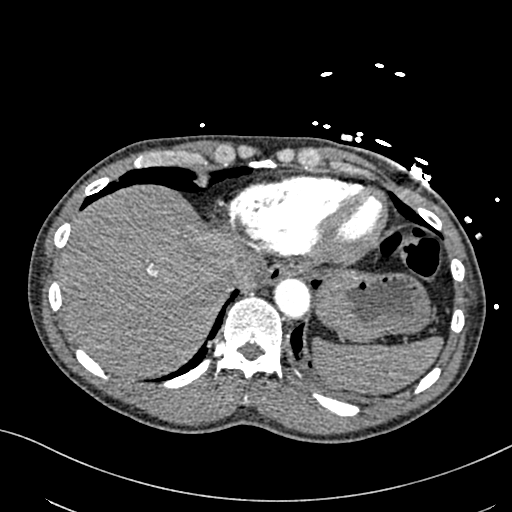
[im 100/329  lung]
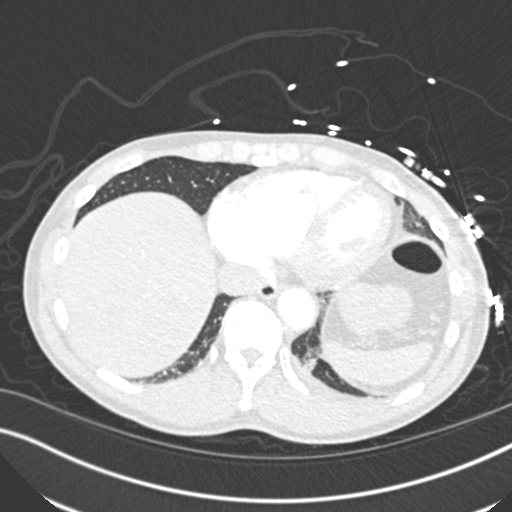
[im 129/329  soft-tissue]
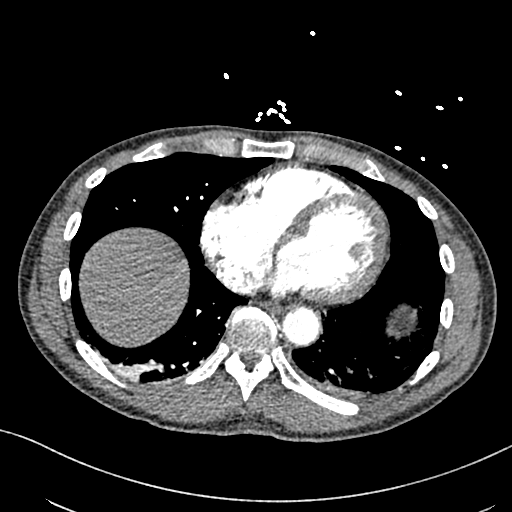
[im 143/329  lung]
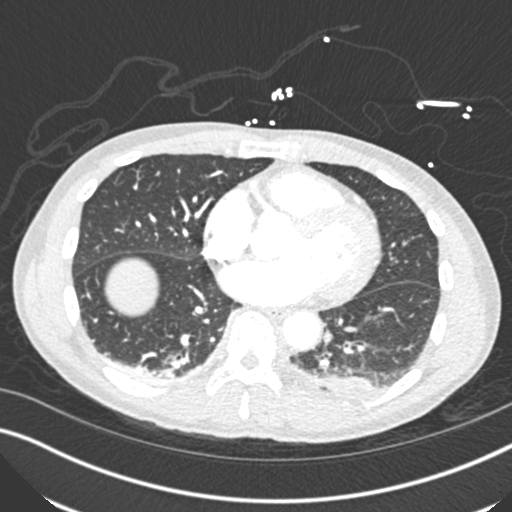
[im 172/329  soft-tissue]
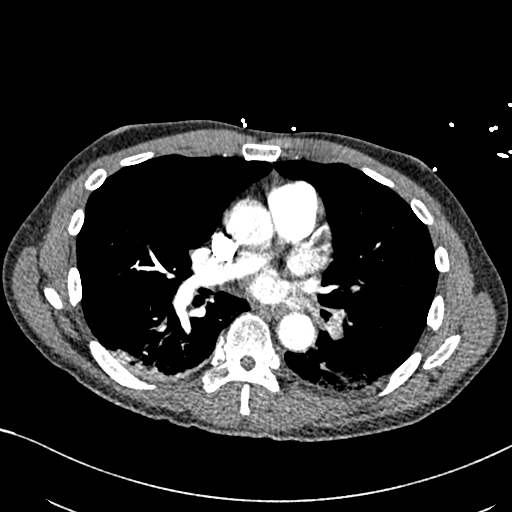
[im 186/329  lung]
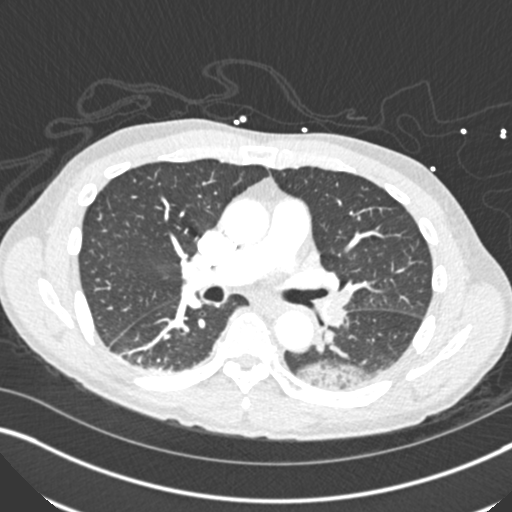
[im 200/329  soft-tissue]
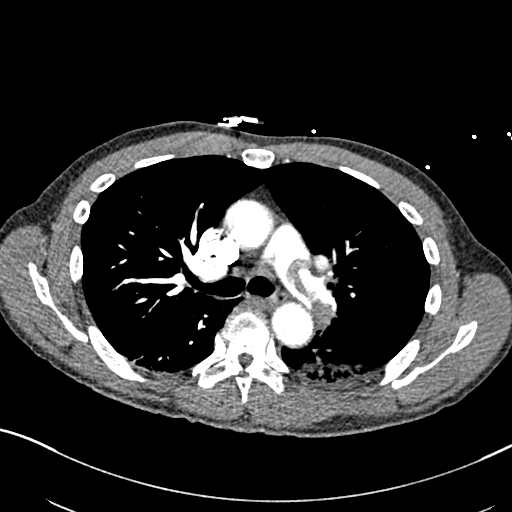
[im 229/329  lung]
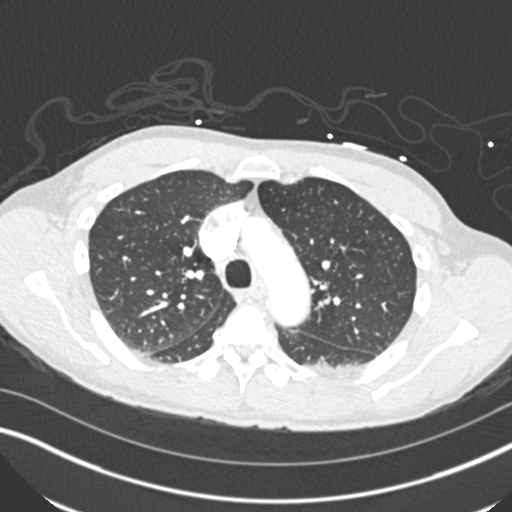
[im 243/329  soft-tissue]
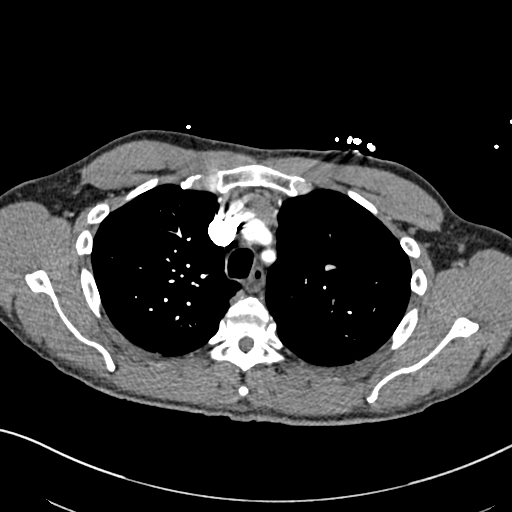
[im 271/329  lung]
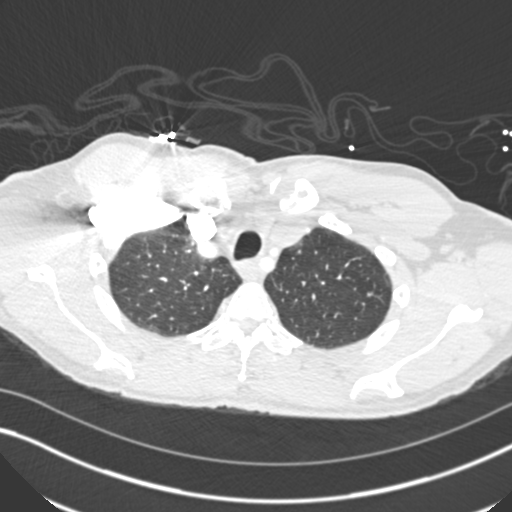
[im 286/329  soft-tissue]
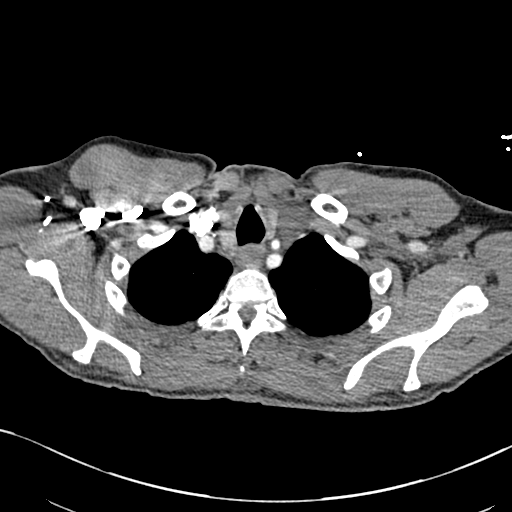
[im 314/329  lung]
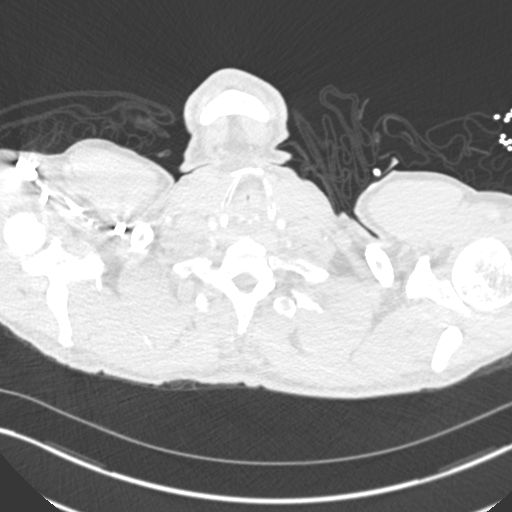

[Series 7: coronal mpr · coronal · 0.67mm/px · 3 of 111 slices shown]
[im 28/111  soft-tissue]
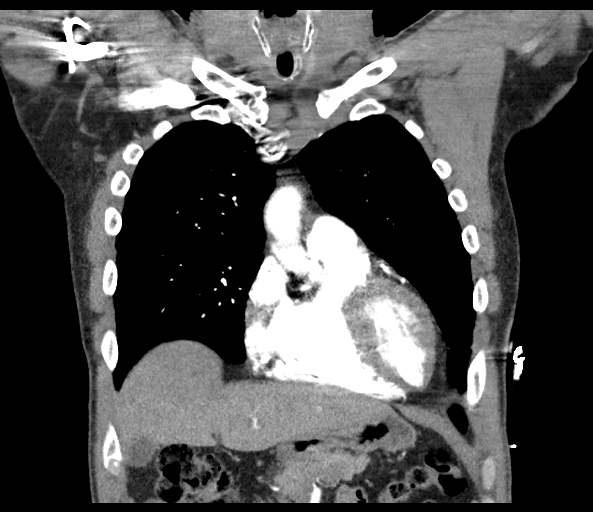
[im 56/111  soft-tissue]
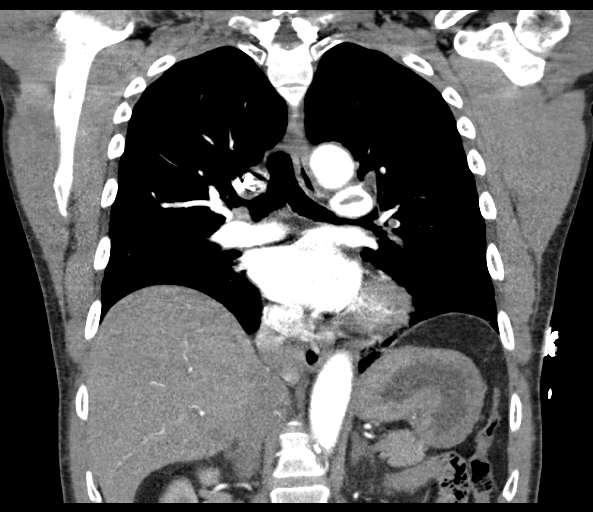
[im 83/111  soft-tissue]
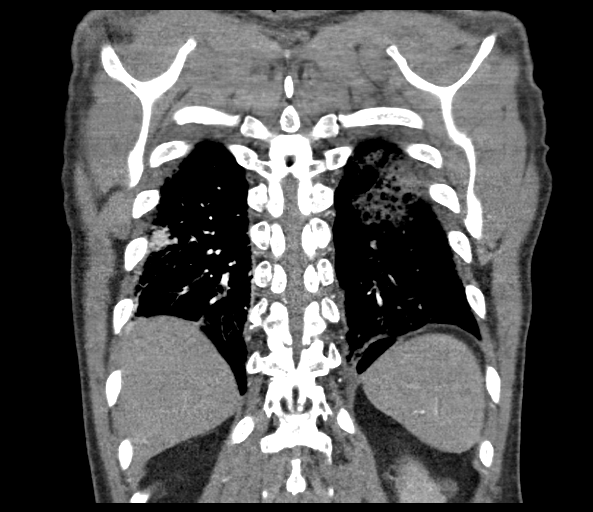

[18 of 46 positions shown; findings below may reference images not displayed]

FINDINGS: Cardiovascular: There is serpiginous low-density filling defect in
the left main pulmonary artery extending into left upper and lower
lobe lobar and segmental branches. There are no filling defects in
the right pulmonary arterial tree. There is no evidence of right
heart strain. Moderate 3 vessel coronary artery calcification. No
evidence of aortic dissection. Atherosclerotic calcifications of the
aortic arch.

Mediastinum/Nodes: No abnormal mediastinal adenopathy. No
pericardial effusion.

Lungs/Pleura: No pneumothorax. Tiny left pleural effusion. Dependent
atelectasis bilaterally. Heterogeneous opacities are present in the
superior segment of the left lower lobe with a peripheral
wedge-shaped appearance worrisome for developing infarct. See image
70 of series 6.

Upper Abdomen: Nonspecific calcification in the left adrenal gland.
Simple cyst in the left kidney.

Musculoskeletal: No vertebral compression deformity.

Review of the MIP images confirms the above findings.
IMPRESSION: Study is positive for central left pulmonary artery pulmonary
thromboembolism. Critical Value/emergent results were called by
telephone at the time of interpretation on 03/19/2017 at [DATE] to
Dr. DOHOON LAUER , who verbally acknowledged these results.

Developing pulmonary infarct in the superior segment of the left
lower lobe.

Aortic Atherosclerosis ([id number]).

## 2018-10-17 DIAGNOSIS — I2510 Atherosclerotic heart disease of native coronary artery without angina pectoris: Secondary | ICD-10-CM | POA: Diagnosis not present

## 2018-10-17 DIAGNOSIS — Z86711 Personal history of pulmonary embolism: Secondary | ICD-10-CM | POA: Diagnosis not present

## 2018-10-17 DIAGNOSIS — I10 Essential (primary) hypertension: Secondary | ICD-10-CM | POA: Diagnosis not present

## 2018-10-17 DIAGNOSIS — E785 Hyperlipidemia, unspecified: Secondary | ICD-10-CM | POA: Diagnosis not present

## 2018-10-17 DIAGNOSIS — I252 Old myocardial infarction: Secondary | ICD-10-CM | POA: Diagnosis not present

## 2018-10-17 DIAGNOSIS — Z87891 Personal history of nicotine dependence: Secondary | ICD-10-CM | POA: Diagnosis not present

## 2018-10-17 DIAGNOSIS — M26621 Arthralgia of right temporomandibular joint: Secondary | ICD-10-CM | POA: Diagnosis not present

## 2018-10-17 DIAGNOSIS — Z86718 Personal history of other venous thrombosis and embolism: Secondary | ICD-10-CM | POA: Diagnosis not present

## 2019-09-06 DIAGNOSIS — I10 Essential (primary) hypertension: Secondary | ICD-10-CM | POA: Diagnosis not present

## 2019-09-06 DIAGNOSIS — Z7901 Long term (current) use of anticoagulants: Secondary | ICD-10-CM | POA: Diagnosis not present

## 2019-09-06 DIAGNOSIS — R001 Bradycardia, unspecified: Secondary | ICD-10-CM | POA: Diagnosis not present

## 2019-09-06 DIAGNOSIS — I2510 Atherosclerotic heart disease of native coronary artery without angina pectoris: Secondary | ICD-10-CM | POA: Diagnosis not present

## 2019-09-06 DIAGNOSIS — Z86718 Personal history of other venous thrombosis and embolism: Secondary | ICD-10-CM | POA: Diagnosis not present

## 2019-09-06 DIAGNOSIS — R9431 Abnormal electrocardiogram [ECG] [EKG]: Secondary | ICD-10-CM | POA: Diagnosis not present

## 2019-09-06 DIAGNOSIS — R5383 Other fatigue: Secondary | ICD-10-CM | POA: Diagnosis not present

## 2019-09-06 DIAGNOSIS — Z87891 Personal history of nicotine dependence: Secondary | ICD-10-CM | POA: Diagnosis not present

## 2019-09-06 DIAGNOSIS — Z79899 Other long term (current) drug therapy: Secondary | ICD-10-CM | POA: Diagnosis not present

## 2019-09-06 DIAGNOSIS — E7800 Pure hypercholesterolemia, unspecified: Secondary | ICD-10-CM | POA: Diagnosis not present

## 2019-09-06 DIAGNOSIS — I252 Old myocardial infarction: Secondary | ICD-10-CM | POA: Diagnosis not present

## 2019-09-06 DIAGNOSIS — R002 Palpitations: Secondary | ICD-10-CM | POA: Diagnosis not present

## 2019-09-06 DIAGNOSIS — Z86711 Personal history of pulmonary embolism: Secondary | ICD-10-CM | POA: Diagnosis not present

## 2019-09-20 DIAGNOSIS — Z8616 Personal history of COVID-19: Secondary | ICD-10-CM | POA: Diagnosis not present

## 2019-09-20 DIAGNOSIS — Z7901 Long term (current) use of anticoagulants: Secondary | ICD-10-CM | POA: Diagnosis not present

## 2019-09-20 DIAGNOSIS — R001 Bradycardia, unspecified: Secondary | ICD-10-CM | POA: Diagnosis not present

## 2019-09-20 DIAGNOSIS — I2510 Atherosclerotic heart disease of native coronary artery without angina pectoris: Secondary | ICD-10-CM | POA: Diagnosis not present

## 2019-09-20 DIAGNOSIS — Z86718 Personal history of other venous thrombosis and embolism: Secondary | ICD-10-CM | POA: Diagnosis not present

## 2019-09-20 DIAGNOSIS — E785 Hyperlipidemia, unspecified: Secondary | ICD-10-CM | POA: Diagnosis not present

## 2019-09-20 DIAGNOSIS — Z79899 Other long term (current) drug therapy: Secondary | ICD-10-CM | POA: Diagnosis not present

## 2019-09-20 DIAGNOSIS — Z720 Tobacco use: Secondary | ICD-10-CM | POA: Diagnosis not present

## 2019-09-20 DIAGNOSIS — I252 Old myocardial infarction: Secondary | ICD-10-CM | POA: Diagnosis not present

## 2019-09-20 DIAGNOSIS — I10 Essential (primary) hypertension: Secondary | ICD-10-CM | POA: Diagnosis not present

## 2019-09-20 DIAGNOSIS — Z86711 Personal history of pulmonary embolism: Secondary | ICD-10-CM | POA: Diagnosis not present

## 2019-09-20 DIAGNOSIS — R0609 Other forms of dyspnea: Secondary | ICD-10-CM | POA: Diagnosis not present

## 2019-09-20 DIAGNOSIS — R5383 Other fatigue: Secondary | ICD-10-CM | POA: Diagnosis not present

## 2019-09-20 DIAGNOSIS — R5382 Chronic fatigue, unspecified: Secondary | ICD-10-CM | POA: Diagnosis not present

## 2019-11-07 DIAGNOSIS — I2510 Atherosclerotic heart disease of native coronary artery without angina pectoris: Secondary | ICD-10-CM | POA: Diagnosis not present

## 2019-11-07 DIAGNOSIS — Z79899 Other long term (current) drug therapy: Secondary | ICD-10-CM | POA: Diagnosis not present

## 2019-11-07 DIAGNOSIS — I10 Essential (primary) hypertension: Secondary | ICD-10-CM | POA: Diagnosis not present

## 2019-11-07 DIAGNOSIS — Z86711 Personal history of pulmonary embolism: Secondary | ICD-10-CM | POA: Diagnosis not present

## 2019-11-07 DIAGNOSIS — I252 Old myocardial infarction: Secondary | ICD-10-CM | POA: Diagnosis not present

## 2019-11-07 DIAGNOSIS — Z86718 Personal history of other venous thrombosis and embolism: Secondary | ICD-10-CM | POA: Diagnosis not present

## 2019-11-07 DIAGNOSIS — E7800 Pure hypercholesterolemia, unspecified: Secondary | ICD-10-CM | POA: Diagnosis not present

## 2019-12-22 IMAGING — US US EXTREM LOW VENOUS BILAT
1 series · 13 of 24 positions shown · non-contrast
Comparison: Chest CTA - 03/19/2017

CLINICAL DATA: History of pulmonary embolism.  Evaluate for DVT.



[Series 1: us extrem low venous bilat · 0.08mm/px · 13 of 65 slices shown]
[im 1/65]
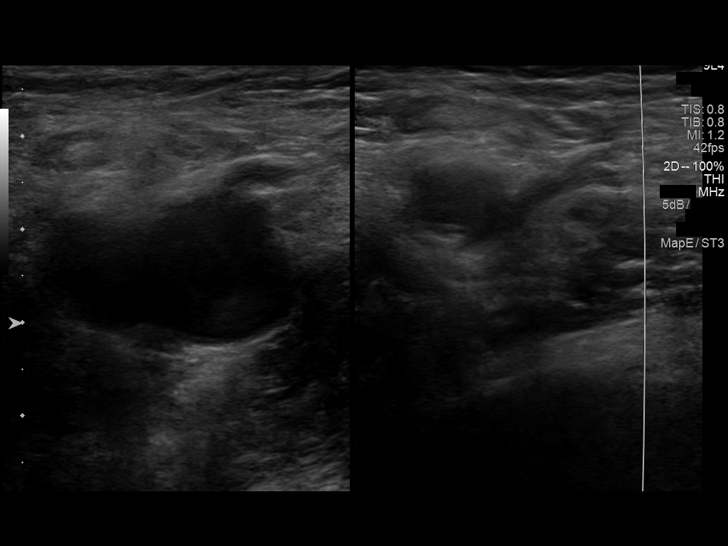
[im 6/65]
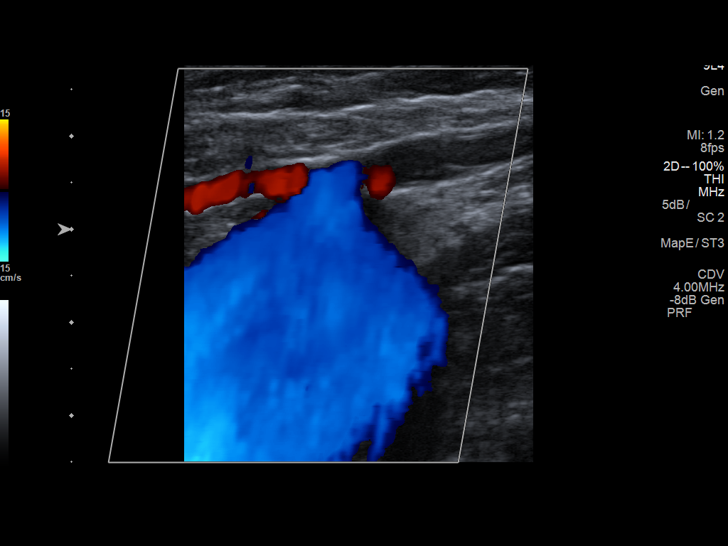
[im 12/65]
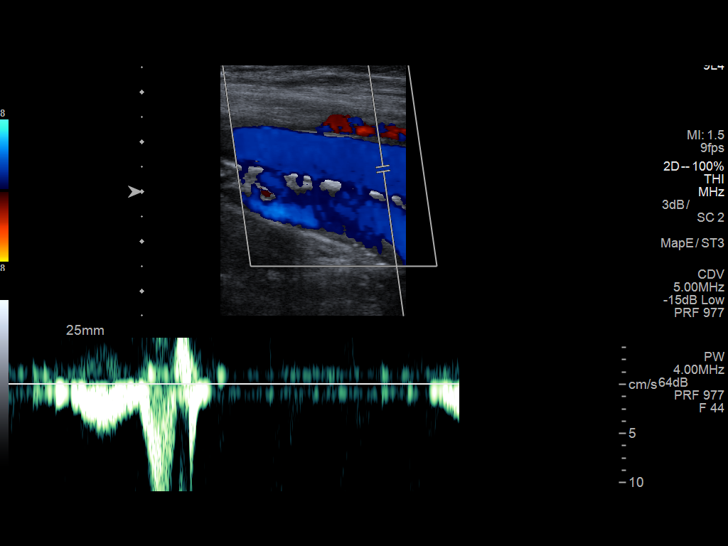
[im 17/65]
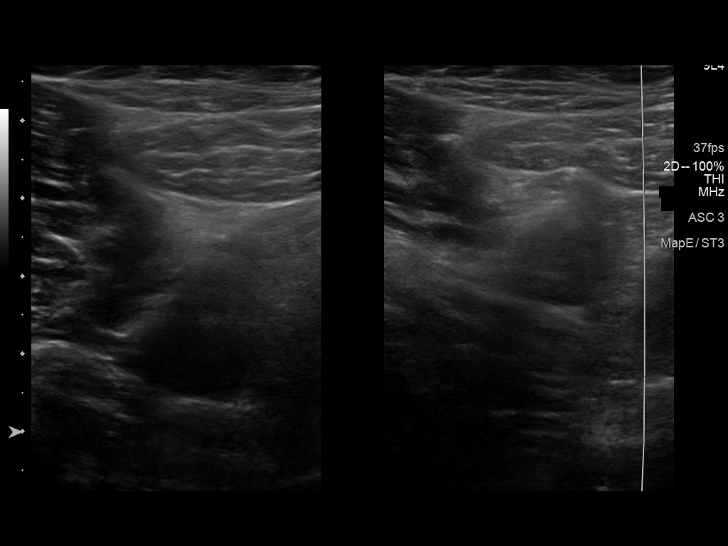
[im 23/65]
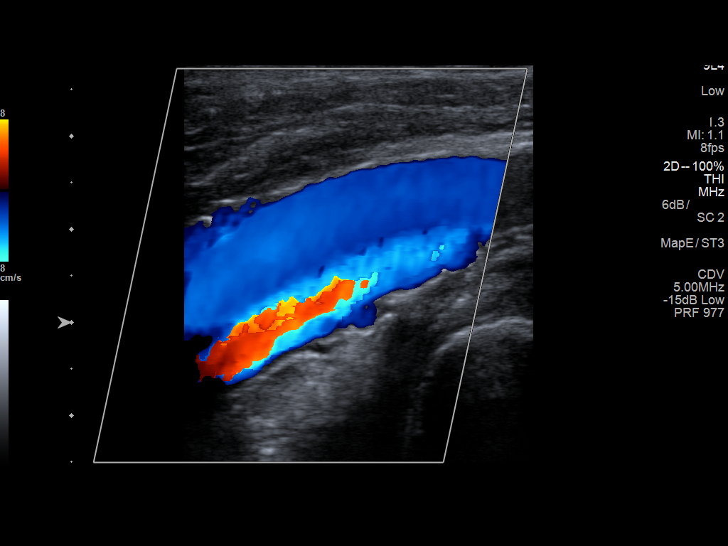
[im 28/65]
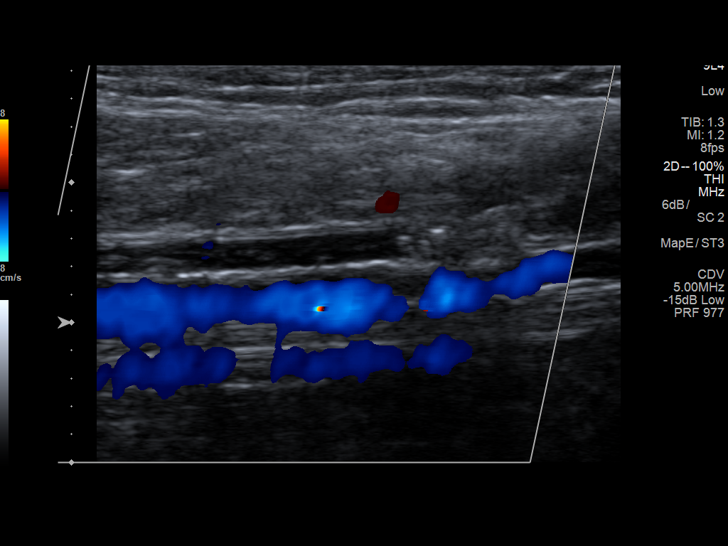
[im 34/65]
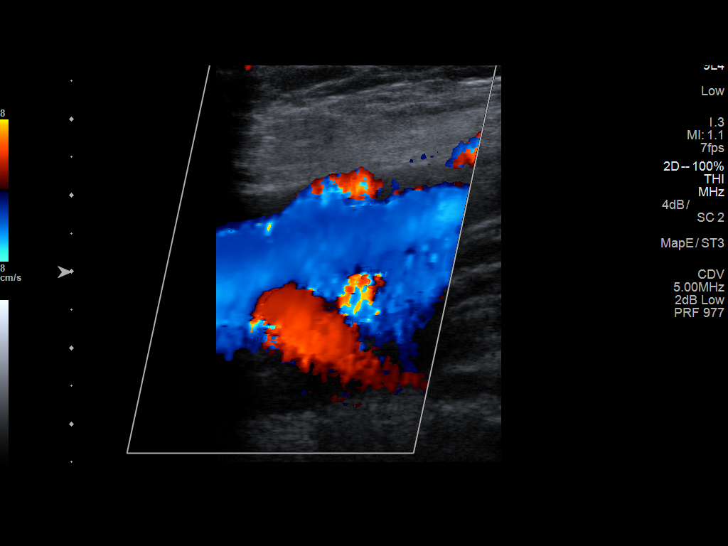
[im 37/65]
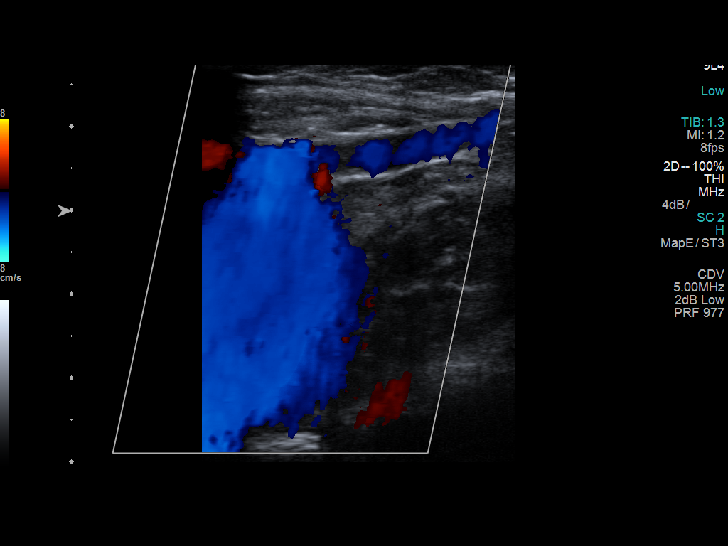
[im 42/65]
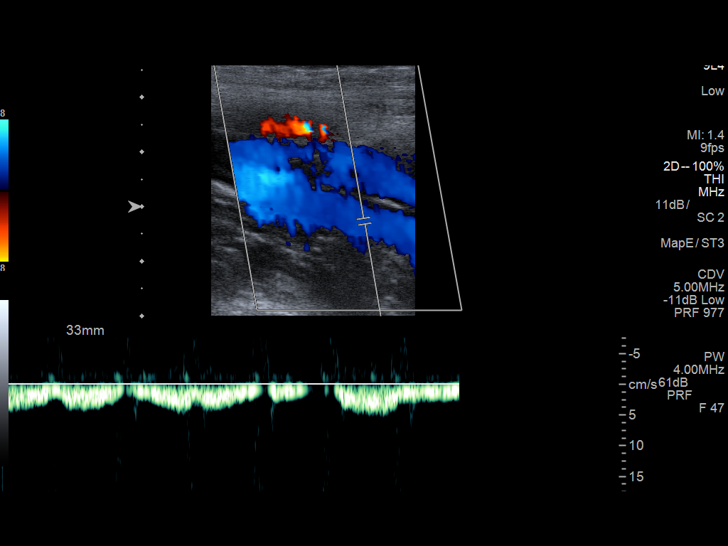
[im 48/65]
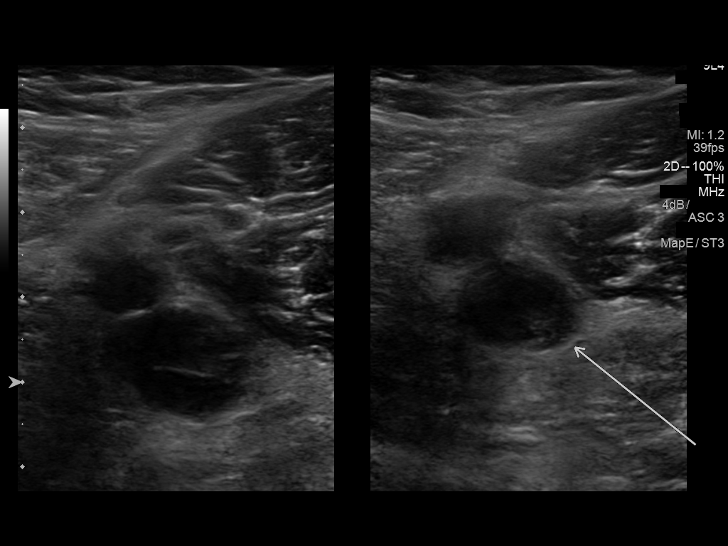
[im 53/65]
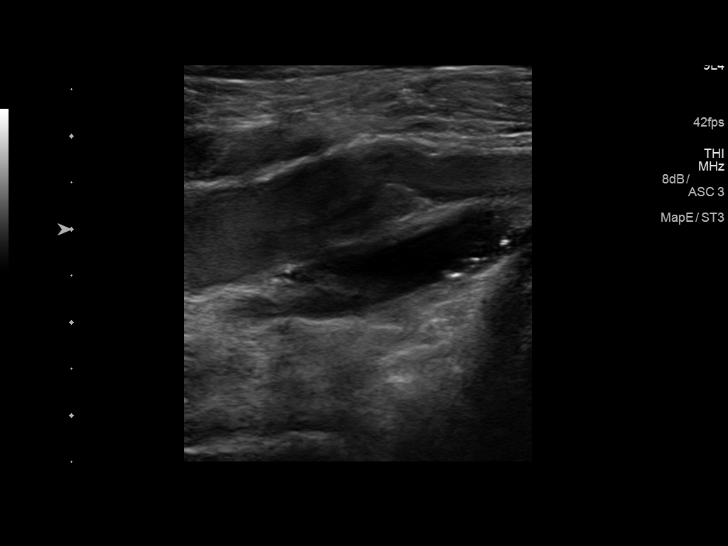
[im 59/65]
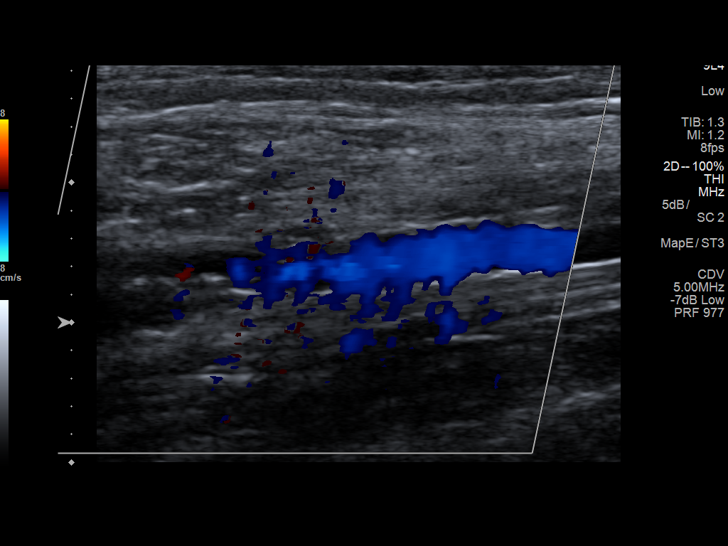
[im 65/65]
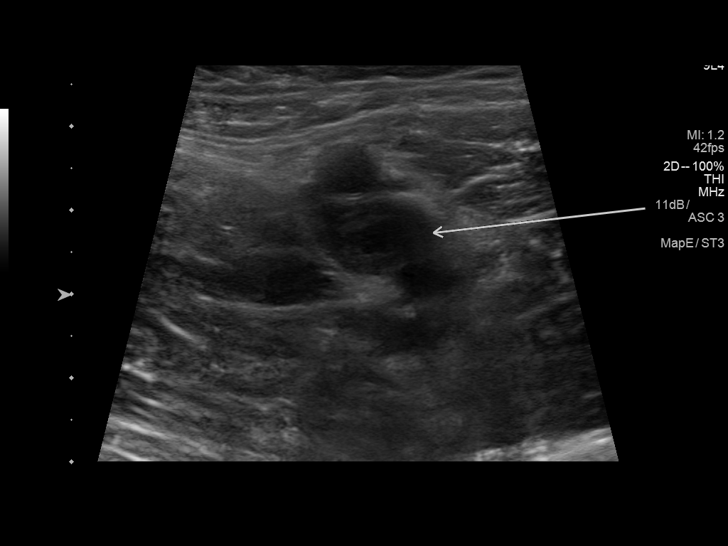

[13 of 24 positions shown; findings below may reference images not displayed]

FINDINGS: RIGHT LOWER EXTREMITY

Common Femoral Vein: No evidence of thrombus. Normal
compressibility, respiratory phasicity and response to augmentation.

Saphenofemoral Junction: No evidence of thrombus. Normal
compressibility and flow on color Doppler imaging.

Profunda Femoral Vein: No evidence of thrombus. Normal
compressibility and flow on color Doppler imaging.

Femoral Vein: No evidence of thrombus. Normal compressibility,
respiratory phasicity and response to augmentation.

Popliteal Vein: No evidence of thrombus. Normal compressibility,
respiratory phasicity and response to augmentation.

Calf Veins: No evidence of thrombus. Normal compressibility and flow
on color Doppler imaging.

Superficial Great Saphenous Vein: No evidence of thrombus. Normal
compressibility.

Other Findings:  None.

LEFT LOWER EXTREMITY

Common Femoral Vein: No evidence of thrombus. Normal
compressibility, respiratory phasicity and response to augmentation.

Saphenofemoral Junction: No evidence of thrombus. Normal
compressibility and flow on color Doppler imaging.

Profunda Femoral Vein: No evidence of thrombus. Normal
compressibility and flow on color Doppler imaging.

Femoral vein: There is hypoechoic nonocclusive DVT within the
proximal and distal aspects of the left femoral vein (images 42, 51
and 65). There is hypoechoic slightly expansile occlusive thrombus
involving the mid aspect of the left femoral vein (image 48).

Popliteal Vein: There is hypoechoic expansile near occlusive DVT
involving the left popliteal vein (images 55).

Calf Veins: No evidence of thrombus. Normal compressibility and flow
on color Doppler imaging.

Superficial Great Saphenous Vein: No evidence of thrombus. Normal
compressibility.

Other Findings:  None.
IMPRESSION: 1. Examination is positive for mixed occlusive though predominantly
nonocclusive DVT involving the left femoral and popliteal veins.
2. No evidence DVT within the right lower extremity.

## 2019-12-31 DIAGNOSIS — F17210 Nicotine dependence, cigarettes, uncomplicated: Secondary | ICD-10-CM | POA: Diagnosis not present

## 2019-12-31 DIAGNOSIS — Z86711 Personal history of pulmonary embolism: Secondary | ICD-10-CM | POA: Diagnosis not present

## 2019-12-31 DIAGNOSIS — R5383 Other fatigue: Secondary | ICD-10-CM | POA: Diagnosis not present

## 2019-12-31 DIAGNOSIS — Z7901 Long term (current) use of anticoagulants: Secondary | ICD-10-CM | POA: Diagnosis not present

## 2019-12-31 DIAGNOSIS — R001 Bradycardia, unspecified: Secondary | ICD-10-CM | POA: Diagnosis not present

## 2019-12-31 DIAGNOSIS — Z86718 Personal history of other venous thrombosis and embolism: Secondary | ICD-10-CM | POA: Diagnosis not present

## 2019-12-31 DIAGNOSIS — I10 Essential (primary) hypertension: Secondary | ICD-10-CM | POA: Diagnosis not present

## 2019-12-31 DIAGNOSIS — Z79899 Other long term (current) drug therapy: Secondary | ICD-10-CM | POA: Diagnosis not present

## 2020-02-07 DIAGNOSIS — E7800 Pure hypercholesterolemia, unspecified: Secondary | ICD-10-CM | POA: Diagnosis not present

## 2020-02-07 DIAGNOSIS — I2510 Atherosclerotic heart disease of native coronary artery without angina pectoris: Secondary | ICD-10-CM | POA: Diagnosis not present

## 2020-02-07 DIAGNOSIS — I110 Hypertensive heart disease with heart failure: Secondary | ICD-10-CM | POA: Diagnosis not present

## 2020-02-07 DIAGNOSIS — I252 Old myocardial infarction: Secondary | ICD-10-CM | POA: Diagnosis not present

## 2020-02-07 DIAGNOSIS — I10 Essential (primary) hypertension: Secondary | ICD-10-CM | POA: Diagnosis not present

## 2020-02-07 DIAGNOSIS — Z86711 Personal history of pulmonary embolism: Secondary | ICD-10-CM | POA: Diagnosis not present

## 2020-02-07 DIAGNOSIS — Z23 Encounter for immunization: Secondary | ICD-10-CM | POA: Diagnosis not present

## 2020-06-15 DIAGNOSIS — Z7901 Long term (current) use of anticoagulants: Secondary | ICD-10-CM | POA: Diagnosis not present

## 2020-06-15 DIAGNOSIS — Z86711 Personal history of pulmonary embolism: Secondary | ICD-10-CM | POA: Diagnosis not present

## 2020-06-15 DIAGNOSIS — Z79899 Other long term (current) drug therapy: Secondary | ICD-10-CM | POA: Diagnosis not present

## 2020-06-15 DIAGNOSIS — E7800 Pure hypercholesterolemia, unspecified: Secondary | ICD-10-CM | POA: Diagnosis not present

## 2020-06-15 DIAGNOSIS — I2510 Atherosclerotic heart disease of native coronary artery without angina pectoris: Secondary | ICD-10-CM | POA: Diagnosis not present

## 2020-06-15 DIAGNOSIS — I10 Essential (primary) hypertension: Secondary | ICD-10-CM | POA: Diagnosis not present

## 2020-06-15 DIAGNOSIS — Z86718 Personal history of other venous thrombosis and embolism: Secondary | ICD-10-CM | POA: Diagnosis not present

## 2020-06-15 DIAGNOSIS — I252 Old myocardial infarction: Secondary | ICD-10-CM | POA: Diagnosis not present

## 2020-07-03 DIAGNOSIS — I10 Essential (primary) hypertension: Secondary | ICD-10-CM | POA: Diagnosis not present

## 2020-07-03 DIAGNOSIS — Z79899 Other long term (current) drug therapy: Secondary | ICD-10-CM | POA: Diagnosis not present

## 2020-07-03 DIAGNOSIS — E785 Hyperlipidemia, unspecified: Secondary | ICD-10-CM | POA: Diagnosis not present

## 2020-07-03 DIAGNOSIS — I2510 Atherosclerotic heart disease of native coronary artery without angina pectoris: Secondary | ICD-10-CM | POA: Diagnosis not present

## 2020-07-03 DIAGNOSIS — Z7901 Long term (current) use of anticoagulants: Secondary | ICD-10-CM | POA: Diagnosis not present

## 2020-07-03 DIAGNOSIS — Z86718 Personal history of other venous thrombosis and embolism: Secondary | ICD-10-CM | POA: Diagnosis not present

## 2020-07-03 DIAGNOSIS — Z720 Tobacco use: Secondary | ICD-10-CM | POA: Diagnosis not present

## 2020-07-03 DIAGNOSIS — Z7982 Long term (current) use of aspirin: Secondary | ICD-10-CM | POA: Diagnosis not present

## 2020-09-08 DIAGNOSIS — L723 Sebaceous cyst: Secondary | ICD-10-CM | POA: Diagnosis not present

## 2020-09-08 DIAGNOSIS — L089 Local infection of the skin and subcutaneous tissue, unspecified: Secondary | ICD-10-CM | POA: Diagnosis not present

## 2020-09-09 DIAGNOSIS — L089 Local infection of the skin and subcutaneous tissue, unspecified: Secondary | ICD-10-CM | POA: Diagnosis not present

## 2020-09-09 DIAGNOSIS — S1190XA Unspecified open wound of unspecified part of neck, initial encounter: Secondary | ICD-10-CM | POA: Diagnosis not present

## 2020-09-09 DIAGNOSIS — L723 Sebaceous cyst: Secondary | ICD-10-CM | POA: Diagnosis not present

## 2020-12-04 DIAGNOSIS — F17290 Nicotine dependence, other tobacco product, uncomplicated: Secondary | ICD-10-CM | POA: Diagnosis not present

## 2020-12-04 DIAGNOSIS — I2510 Atherosclerotic heart disease of native coronary artery without angina pectoris: Secondary | ICD-10-CM | POA: Diagnosis not present

## 2020-12-04 DIAGNOSIS — I10 Essential (primary) hypertension: Secondary | ICD-10-CM | POA: Diagnosis not present

## 2020-12-04 DIAGNOSIS — R001 Bradycardia, unspecified: Secondary | ICD-10-CM | POA: Diagnosis not present

## 2020-12-04 DIAGNOSIS — Z86718 Personal history of other venous thrombosis and embolism: Secondary | ICD-10-CM | POA: Diagnosis not present

## 2020-12-04 DIAGNOSIS — I252 Old myocardial infarction: Secondary | ICD-10-CM | POA: Diagnosis not present

## 2020-12-04 DIAGNOSIS — E7800 Pure hypercholesterolemia, unspecified: Secondary | ICD-10-CM | POA: Diagnosis not present

## 2020-12-04 DIAGNOSIS — Z7901 Long term (current) use of anticoagulants: Secondary | ICD-10-CM | POA: Diagnosis not present

## 2021-02-02 DIAGNOSIS — E785 Hyperlipidemia, unspecified: Secondary | ICD-10-CM | POA: Diagnosis not present

## 2021-02-02 DIAGNOSIS — I2510 Atherosclerotic heart disease of native coronary artery without angina pectoris: Secondary | ICD-10-CM | POA: Diagnosis not present

## 2023-06-01 ENCOUNTER — Emergency Department (HOSPITAL_COMMUNITY)
Admission: EM | Admit: 2023-06-01 | Discharge: 2023-06-01 | Disposition: A | Discharge status: Home / Self Care | Attending: Emergency Medicine | Admitting: Emergency Medicine

## 2023-06-01 ENCOUNTER — Emergency Department (HOSPITAL_COMMUNITY)

## 2023-06-01 ENCOUNTER — Other Ambulatory Visit: Payer: Self-pay

## 2023-06-01 ENCOUNTER — Encounter (HOSPITAL_COMMUNITY): Payer: Self-pay | Admitting: Emergency Medicine

## 2023-06-01 DIAGNOSIS — I2510 Atherosclerotic heart disease of native coronary artery without angina pectoris: Secondary | ICD-10-CM | POA: Insufficient documentation

## 2023-06-01 DIAGNOSIS — R0602 Shortness of breath: Secondary | ICD-10-CM | POA: Insufficient documentation

## 2023-06-01 DIAGNOSIS — N3000 Acute cystitis without hematuria: Secondary | ICD-10-CM | POA: Diagnosis not present

## 2023-06-01 DIAGNOSIS — Z7982 Long term (current) use of aspirin: Secondary | ICD-10-CM | POA: Diagnosis not present

## 2023-06-01 DIAGNOSIS — Z79899 Other long term (current) drug therapy: Secondary | ICD-10-CM | POA: Insufficient documentation

## 2023-06-01 DIAGNOSIS — R079 Chest pain, unspecified: Secondary | ICD-10-CM | POA: Insufficient documentation

## 2023-06-01 DIAGNOSIS — I10 Essential (primary) hypertension: Secondary | ICD-10-CM | POA: Diagnosis not present

## 2023-06-01 DIAGNOSIS — Z7901 Long term (current) use of anticoagulants: Secondary | ICD-10-CM | POA: Diagnosis not present

## 2023-06-01 LAB — URINALYSIS, ROUTINE W REFLEX MICROSCOPIC
Bilirubin Urine: NEGATIVE
Glucose, UA: NEGATIVE mg/dL
Ketones, ur: NEGATIVE mg/dL
Nitrite: NEGATIVE
Protein, ur: NEGATIVE mg/dL
Specific Gravity, Urine: 1.014 (ref 1.005–1.030)
WBC, UA: >50 WBC/hpf (ref 0–5)
pH: 5 (ref 5.0–8.0)

## 2023-06-01 LAB — TROPONIN I (HIGH SENSITIVITY)
Troponin I (High Sensitivity): 7 ng/L (ref <18)
Troponin I (High Sensitivity): 7 ng/L (ref <18)

## 2023-06-01 LAB — HEPATIC FUNCTION PANEL
ALT: 13 U/L (ref 0–44)
AST: 20 U/L (ref 15–41)
Albumin: 4 g/dL (ref 3.5–5.0)
Alkaline Phosphatase: 45 U/L (ref 38–126)
Bilirubin, Direct: 0.2 mg/dL (ref 0.0–0.2)
Indirect Bilirubin: 0.5 mg/dL (ref 0.3–0.9)
Total Bilirubin: 0.7 mg/dL (ref 0.0–1.2)
Total Protein: 7.7 g/dL (ref 6.5–8.1)

## 2023-06-01 LAB — CBC
HCT: 40.3 % (ref 39.0–52.0)
Hemoglobin: 13.1 g/dL (ref 13.0–17.0)
MCH: 28.3 pg (ref 26.0–34.0)
MCHC: 32.5 g/dL (ref 30.0–36.0)
MCV: 87 fL (ref 80.0–100.0)
Platelets: 308 10*3/uL (ref 150–400)
RBC: 4.63 MIL/uL (ref 4.22–5.81)
RDW: 14 % (ref 11.5–15.5)
WBC: 8.2 10*3/uL (ref 4.0–10.5)
nRBC: 0 % (ref 0.0–0.2)

## 2023-06-01 LAB — RESP PANEL BY RT-PCR (RSV, FLU A&B, COVID)  RVPGX2
Influenza A by PCR: NEGATIVE
Influenza B by PCR: NEGATIVE
Resp Syncytial Virus by PCR: NEGATIVE
SARS Coronavirus 2 by RT PCR: NEGATIVE

## 2023-06-01 LAB — BASIC METABOLIC PANEL
Anion gap: 7 (ref 5–15)
BUN: 19 mg/dL (ref 8–23)
CO2: 21 mmol/L — ABNORMAL LOW (ref 22–32)
Calcium: 9.1 mg/dL (ref 8.9–10.3)
Chloride: 110 mmol/L (ref 98–111)
Creatinine, Ser: 1.28 mg/dL — ABNORMAL HIGH (ref 0.61–1.24)
GFR, Estimated: 59 mL/min — ABNORMAL LOW (ref >60)
Glucose, Bld: 98 mg/dL (ref 70–99)
Potassium: 4 mmol/L (ref 3.5–5.1)
Sodium: 138 mmol/L (ref 135–145)

## 2023-06-01 LAB — BRAIN NATRIURETIC PEPTIDE: B Natriuretic Peptide: 22.1 pg/mL (ref 0.0–100.0)

## 2023-06-01 MED ORDER — CEPHALEXIN 500 MG PO CAPS: 500.0000 mg | ORAL_CAPSULE | Freq: Two times a day (BID) | ORAL | 0 refills | Status: AC

## 2023-06-01 MED ORDER — ACETAMINOPHEN 500 MG PO TABS
1000.0000 mg | ORAL_TABLET | Freq: Once | ORAL | Status: AC
Start: 2023-06-01 — End: 2023-06-01
  Administered 2023-06-01: 1000 mg via ORAL
  Filled 2023-06-01: qty 2

## 2023-06-01 MED ORDER — IOHEXOL 350 MG/ML SOLN
75.0000 mL | Freq: Once | INTRAVENOUS | Status: AC | PRN
  Administered 2023-06-01: 75 mL via INTRAVENOUS

## 2023-06-01 NOTE — ED Triage Notes (Signed)
 Pt reports CP and SHOB x 1 week. Hx of HTN/HLD. Reports hx of MI with no stent placement. Nothing makes pain or SHOB better or worse.

## 2023-06-01 NOTE — ED Notes (Signed)
 AVS provided by edp was reviewed with pt. Pt verbalized understanding with no additional questions at this time. Pt going home with s/o at bedside

## 2023-06-01 NOTE — ED Notes (Signed)
 Pt ambulated to and from the bathroom without needing assistance. No distress upon exertion. Pt reconnected to VS monitor.

## 2023-06-01 NOTE — ED Notes (Signed)
 Patient transported to CT

## 2023-06-01 NOTE — Discharge Instructions (Addendum)
 Follow-up with your primary care doctor or cardiologist if your symptoms are improving.  Return to the emergency room if you have any worsening symptoms.

## 2023-06-01 NOTE — ED Provider Notes (Signed)
 [place] EMERGENCY DEPARTMENT AT [facility] Provider Note   CSN: [id number] Arrival date & time: 06/01/23  1606     History  Chief Complaint  Patient presents with   Chest Pain    Kevin Burnett is a 73 y.o. male.  Patient is a having 73 year old male with a history of hyperlipidemia, hypertension, coronary artery disease, DVT/PE on Eliquis who presents with right side chest pain.  He said it started intermittently a few days ago but over the last 24 hours it has been more constant.  It is in his right chest and right flank area.  Says it is worse with movements.  It is also worse with deep breathing.  He feels bit more short of breath than normal.  He had flulike illness about 2 weeks ago and has had a little bit of increased shortness of breath since that time.  He had some coughing related to that flulike illness but denies any ongoing coughing.  No fevers.  No leg swelling.  No calf tenderness.       Home Medications Prior to Admission medications   Medication Sig Start Date End Date Taking? Authorizing Provider  amLODipine (NORVASC) 10 MG tablet TAKE ONE TABLET BY MOUTH EVERY DAY 01/06/11   Rollene Rotunda, MD  aspirin 81 MG tablet Take 81 mg by mouth daily.      [provider]  atorvastatin (LIPITOR) 40 MG tablet Take 40 mg by mouth daily. 01/25/17   [provider]  carvedilol (COREG) 6.25 MG tablet TAKE ONE TABLET BY MOUTH TWICE DAILY 07/26/10   Rollene Rotunda, MD  CRESTOR 40 MG tablet TAKE ONE TABLET BY MOUTH EVERY DAY Patient not taking: Reported on 03/19/2017 04/28/11   Rollene Rotunda, MD  ELIQUIS STARTER PACK (ELIQUIS STARTER PACK) 5 MG TABS Take as directed on package: start with two-5mg  tablets twice daily for 7 days. On day 8, switch to one-5mg  tablet twice daily. 03/21/17   Erick Blinks, MD  Phenyleph-Doxylamine-DM-APAP (ALKA-SELTZER PLS ALLERGY & [facility] PO) Take 1 Dose by mouth daily as needed (cough).    [provider]       Allergies    Lipitor [atorvastatin calcium]    Review of Systems   Review of Systems  Constitutional:  Negative for chills, diaphoresis, fatigue and fever.  HENT:  Negative for congestion, rhinorrhea and sneezing.   Eyes: Negative.   Respiratory:  Positive for shortness of breath. Negative for cough and chest tightness.   Cardiovascular:  Positive for chest pain. Negative for leg swelling.  Gastrointestinal:  Negative for abdominal pain, blood in stool, diarrhea, nausea and vomiting.  Genitourinary:  Negative for difficulty urinating, flank pain, frequency and hematuria.  Musculoskeletal:  Negative for arthralgias and back pain.  Skin:  Negative for rash.  Neurological:  Negative for dizziness, speech difficulty, weakness, numbness and headaches.    Physical Exam Updated Vital Signs BP 123/72   Pulse (!) 48   Temp 98.2 F (36.8 C) (Oral)   Resp 18   SpO2 100%  Physical Exam Constitutional:      Appearance: He is well-developed.  HENT:     Head: Normocephalic and atraumatic.  Eyes:     Pupils: Pupils are equal, round, and reactive to light.  Cardiovascular:     Rate and Rhythm: Normal rate and regular rhythm.     Heart sounds: Normal heart sounds.  Pulmonary:     Effort: Pulmonary effort is normal. No respiratory distress.  Breath sounds: Normal breath sounds. No wheezing or rales.  Chest:     Chest wall: No tenderness.     Comments: No tenderness on palpation of the right chest wall, no rashes Abdominal:     General: Bowel sounds are normal.     Palpations: Abdomen is soft.     Tenderness: There is no abdominal tenderness. There is no guarding or rebound.  Musculoskeletal:        General: Normal range of motion.     Cervical back: Normal range of motion and neck supple.     Comments: No edema or calf tenderness  Lymphadenopathy:     Cervical: No cervical adenopathy.  Skin:    General: Skin is warm and dry.     Findings: No rash.  Neurological:     Mental  Status: He is alert and oriented to person, place, and time.     ED Results / Procedures / Treatments   Labs (all labs ordered are listed, but only abnormal results are displayed) Labs Reviewed  BASIC METABOLIC PANEL - Abnormal; Notable for the following components:      Result Value   CO2 21 (*)    Creatinine, Ser 1.28 (*)    GFR, Estimated 59 (*)    All other components within normal limits  URINALYSIS, ROUTINE W REFLEX MICROSCOPIC - Abnormal; Notable for the following components:   APPearance HAZY (*)    Hgb urine dipstick MODERATE (*)    Leukocytes,Ua LARGE (*)    Bacteria, UA RARE (*)    All other components within normal limits  RESP PANEL BY RT-PCR (RSV, FLU A&B, COVID)  RVPGX2  CBC  BRAIN NATRIURETIC PEPTIDE  HEPATIC FUNCTION PANEL  TROPONIN I (HIGH SENSITIVITY)  TROPONIN I (HIGH SENSITIVITY)    EKG EKG Interpretation Date/Time:  Thursday June 01 2023 16:12:36 EDT Ventricular Rate:  55 PR Interval:  190 QRS Duration:  86 QT Interval:  408 QTC Calculation: 390 R Axis:   -30  Text Interpretation: Sinus bradycardia Left axis deviation Minimal voltage criteria for LVH, may be normal variant ( R in aVL ) Abnormal ECG When compared with ECG of 19-Mar-2017 10:34, PREVIOUS ECG IS PRESENT since last tracing no significant change Confirmed by Rolan Bucco [phone]) on 06/01/2023 9:04:34 PM  Radiology CT Angio Chest PE W/Cm &/Or Wo Cm Result Date: 06/01/2023 CLINICAL DATA:  Chest pain and shortness of breath. EXAM: CT ANGIOGRAPHY CHEST WITH CONTRAST TECHNIQUE: Multidetector CT imaging of the chest was performed using the standard protocol during bolus administration of intravenous contrast. Multiplanar CT image reconstructions and MIPs were obtained to evaluate the vascular anatomy. RADIATION DOSE REDUCTION: This exam was performed according to the departmental dose-optimization program which includes automated exposure control, adjustment of the mA and/or kV according to  patient size and/or use of iterative reconstruction technique. CONTRAST:  75mL OMNIPAQUE IOHEXOL 350 MG/ML SOLN COMPARISON:  CT angiogram chest 03/19/2017 FINDINGS: Cardiovascular: Satisfactory opacification of the pulmonary arteries to the segmental level. No evidence of pulmonary embolism. Normal heart size. No pericardial effusion. Mediastinum/Nodes: No enlarged mediastinal, hilar, or axillary lymph nodes. Thyroid gland, trachea, and esophagus demonstrate no significant findings. Lungs/Pleura: There are atelectatic changes in the bilateral lower lobes and lingula. There is no pleural effusion or pneumothorax. There are minimal secretions in the right mainstem bronchus. Upper Abdomen: There is a 2.6 cm cyst in the left kidney. Musculoskeletal: No chest wall abnormality. No acute or significant osseous findings. Review of the MIP images confirms the  above findings. IMPRESSION: 1. No evidence for pulmonary embolism. 2. Atelectatic changes in the bilateral lower lobes and lingula. 3. Minimal secretions in the right mainstem bronchus. 4. Left Bosniak I benign renal cyst measuring 2.6 cm. No follow-up imaging is recommended. JACR 2018 Feb; 264-273, Management of the Incidental Renal Mass on CT, [employer] 2021; 814-848, Bosniak Classification of Cystic Renal Masses, Version 2019. Electronically Signed   By: Darliss Cheney M.D.   On: 06/01/2023 22:51   DG Chest 2 View Result Date: 06/01/2023 CLINICAL DATA:  cp EXAM: CHEST - 2 VIEW COMPARISON:  Chest x-ray 03/19/2017 FINDINGS: The heart and mediastinal contours are within normal limits. No focal consolidation. No pulmonary edema. No pleural effusion. No pneumothorax. No acute osseous abnormality. IMPRESSION: No active cardiopulmonary disease. Electronically Signed   By: Tish Frederickson M.D.   On: 06/01/2023 20:02    Procedures Procedures    Medications Ordered in ED Medications  acetaminophen (TYLENOL) tablet 1,000 mg (1,000 mg Oral Given 06/01/23 2002)   iohexol (OMNIPAQUE) 350 MG/ML injection 75 mL (75 mLs Intravenous Contrast Given 06/01/23 2058)    ED Course/ Medical Decision Making/ A&P                                 Medical Decision Making Amount and/or Complexity of Data Reviewed Labs: ordered. Radiology: ordered.  Risk OTC drugs. Prescription drug management.   Patient is a 73 year old who presents with right side chest pain.  It is worse with movement and twisting.  EKG does not show any ischemic changes.  He is mildly bradycardic but on chart review, it seems like his heart rate is normally in the 50s and 60s.  His blood pressure is good.  He is not symptomatic.  His troponins are negative.  Chest x-ray was interpreted by me and confirmed by the radiologist to show no evidence of pneumonia.  No pulmonary edema.  Given his prior history of PE, CT scan was performed.  There is no evidence of PE.  No pneumonia.  No other acute abnormality.  He does not have any hypoxia or increased work of breathing.  He does not have any associated abdominal pain which would be more concerning for other etiology such as gallbladder disease.  Suspect is musculoskeletal and that is worse when he tries to sit up or twist.  He was discharged home in good condition.  He was encouraged to follow-up with his primary care doctor or cardiologist.  Return precautions were given.  His urine did show some signs of infection.  Will go ahead and start him on antibiotics in case that is contributing to his upper back/chest pain. Final Clinical Impression(s) / ED Diagnoses Final diagnoses:  Nonspecific chest pain  Acute cystitis without hematuria    Rx / DC Orders ED Discharge Orders     None         Rolan Bucco, MD 06/01/23 2320

## 2023-06-01 NOTE — ED Notes (Signed)
 Pt continues to complain of right sided chest pain that radiates into the right back that started last Sunday 3/9. Pain worsens with movements ex when going to stand up and deep breaths. Denies injury or trauma. 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 Has a cardiac hx. Wife at bedside. Pt on VS monitor. IV saline locked. Call bell within reach. No additional needs.

## 2023-06-01 NOTE — ED Provider Triage Note (Signed)
 Emergency Medicine Provider Triage Evaluation Note  KURT HOFFMEIER , a 73 y.o. male  was evaluated in triage.  Pt complains of chest pain. Reports persistent R-sided chest pain for the past 3 days with associated dyspnea. 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 Takes eliquis BID for history of PE.  Review of Systems  Positive: CP, SOB Negative: Leg swelling  Physical Exam  BP (!) 148/81 (BP Location: Right Arm)   Pulse 61   Temp 99.2 F (37.3 C)   Resp 18   SpO2 100%  Gen:   Awake, no distress   Resp:  Normal effort  MSK:   Moves extremities without difficulty  Other:    Medical Decision Making  Medically screening exam initiated at 4:36 PM.  Appropriate orders placed.  Lovey Newcomer was informed that the remainder of the evaluation will be completed by another provider, this initial triage assessment does not replace that evaluation, and the importance of remaining in the ED until their evaluation is complete.    Maxwell Marion, PA-C 06/01/23 [phone]
